# Patient Record
Sex: Male | Born: 1965
Health system: Southern US, Community
[De-identification: ages and names within clinical notes are randomized; demographics above are authoritative.]

## PROBLEM LIST (undated history)

## (undated) DIAGNOSIS — E785 Hyperlipidemia, unspecified: Secondary | ICD-10-CM

## (undated) DIAGNOSIS — I10 Essential (primary) hypertension: Secondary | ICD-10-CM

## (undated) HISTORY — DX: Hyperlipidemia, unspecified: E78.5

## (undated) HISTORY — DX: Essential (primary) hypertension: I10

---

## 1998-03-20 ENCOUNTER — Encounter: Admission: RE | Admit: 1998-03-20 | Discharge: 1998-06-18 | Payer: Self-pay

## 2011-01-03 ENCOUNTER — Encounter: Payer: 59 | Attending: Internal Medicine | Admitting: *Deleted

## 2011-01-04 ENCOUNTER — Encounter: Payer: Self-pay | Admitting: *Deleted

## 2011-01-04 NOTE — Patient Instructions (Addendum)
Patient will schedule Core Diabetes Courses II and III as soon as possible and follow up prn.

## 2011-01-04 NOTE — Progress Notes (Signed)
  Patient was seen on 01/03/11 for the first of a series of three diabetes self-management courses at the [facility]. The following learning objectives were met by the patient during this course:   Defines the role of glucose and insulin  Identifies type of diabetes and pathophysiology  Defines the diagnostic criteria for diabetes and prediabetes  States the risk factors for Type 2 Diabetes  States the symptoms of Type 2 Diabetes  Defines Type 2 Diabetes treatment goals  Defines Type 2 Diabetes treatment options  States the rationale for glucose monitoring  Identifies A1C, glucose targets, and testing times  Identifies proper sharps disposal  Defines the purpose of a diabetes food plan  Identifies carbohydrate food groups  Defines effects of carbohydrate foods on glucose levels  Identifies carbohydrate choices/grams/food labels  States benefits of physical activity and effect on glucose  Review of suggested activity guidelines  A1c level: 7.1% (12/14/10 - per Gilman Buttner, RN @ [facility])  Handouts given during class include:  Type 2 Diabetes: Basics Book  My Food Plan Book  Food and Activity Log  Mr[employer] Quick & Easy Diabetic Cooking cookbook  Patient has established the following initial goals:  Increase exercise.  Follow a diabetes meal plan.  Take medications appropriately.  Monitor blood glucose.  Keep doctor's appointments.  Lose weight.  Get medical tests/labs done.  Follow-Up Plan: Pt will contact [facility] with the dates he plans to attend Core Classes II and III.

## 2011-04-26 ENCOUNTER — Encounter: Payer: 59 | Attending: Internal Medicine | Admitting: Dietician

## 2011-04-27 NOTE — Progress Notes (Signed)
  Patient was seen on 04/26/2011 for the second of a series of three diabetes self-management courses at the [facility]. The following learning objectives were met by the patient during this course:   Explain basic nutrition maintenance and quality assurance  Describe causes, symptoms and treatment of hypoglycemia and hyperglycemia  Explain how to manage diabetes during illness  Describe the importance of good nutrition for health and healthy eating strategies  List strategies to follow meal plan when dining out  Describe the effects of alcohol on glucose and how to use it safely  Describe problem solving skills for day-to-day glucose challenges  Describe strategies to use when treatment plan needs to change  Identify important factors involved in successful weight loss  Describe ways to remain physically active  Describe the impact of regular activity on insulin resistance  Patient updated their initials goals from Core Class I to include: Continues to work on self-management goals for Core Class 3  Handouts given in class:  Refrigerator magnet for Sick Day Guidelines  [facility] Oral medication/insulin handout  Follow-Up Plan: Patient will attend the final class of the ADA Diabetes Self-Care Education.

## 2011-06-07 ENCOUNTER — Encounter: Payer: 59 | Attending: Internal Medicine

## 2011-06-07 DIAGNOSIS — Z713 Dietary counseling and surveillance: Secondary | ICD-10-CM | POA: Insufficient documentation

## 2011-06-07 DIAGNOSIS — E119 Type 2 diabetes mellitus without complications: Secondary | ICD-10-CM | POA: Insufficient documentation

## 2011-06-13 NOTE — Progress Notes (Signed)
  Patient was seen on 06/07/2011 for the third of a series of three diabetes self-management courses at the [facility]. The following learning objectives were met by the patient during this course:    Describe how diabetes changes over time   Identify diabetes complications and ways to prevent them   Describe strategies that can promote heart health including lowering blood pressure and cholesterol   Describe strategies to lower dietary fat and sodium in the diet   Identify physical activities that benefit cardiovascular health   Evaluate success in meeting personal goal   Describe the belief that they can live successfully with diabetes day to day   Establish 2-3 goals that they will plan to diligently work on until they return for the free 91-month follow-up visit  The following handouts were given in class:  3 Month Follow Up Visit handout  Goal setting handout  Class evaluation form  Your patient has established the following 3 month goals for diabetes self-care:  Be active 45 minutes once a day.  Take my diabetes medications as scheduled.  Test my blood glucose 2-3 times every day.  Follow-Up Plan: Patient will attend a 3 month follow-up visit for diabetes self-management education.

## 2011-09-13 ENCOUNTER — Ambulatory Visit: Payer: 59 | Admitting: Dietician

## 2012-02-24 ENCOUNTER — Ambulatory Visit
Admission: RE | Admit: 2012-02-24 | Discharge: 2012-02-24 | Disposition: A | Discharge status: Home / Self Care | Payer: No Typology Code available for payment source | Source: Ambulatory Visit | Attending: Infectious Diseases | Admitting: Infectious Diseases

## 2012-02-24 ENCOUNTER — Other Ambulatory Visit: Payer: Self-pay | Admitting: Infectious Diseases

## 2012-02-24 DIAGNOSIS — Z9289 Personal history of other medical treatment: Secondary | ICD-10-CM

## 2012-07-02 ENCOUNTER — Ambulatory Visit (INDEPENDENT_AMBULATORY_CARE_PROVIDER_SITE_OTHER): Payer: 59 | Admitting: Family Medicine

## 2012-07-02 VITALS — BP 136/80 | HR 83 | Ht 66.0 in | Wt 175.0 lb

## 2012-07-02 DIAGNOSIS — E119 Type 2 diabetes mellitus without complications: Secondary | ICD-10-CM

## 2012-07-02 DIAGNOSIS — E1169 Type 2 diabetes mellitus with other specified complication: Secondary | ICD-10-CM | POA: Insufficient documentation

## 2012-07-02 NOTE — Progress Notes (Signed)
Subjective:  Patient presents today for 3 month diabetes follow-up as part of the employer-sponsored Link to Wellness program. Current diabetes regimen includes Victoza, metformin, and Actos. Patient also continues on daily ARB and statin. Most recent MD follow-up was 2 month ago. Patient has a pending appt for October. No med changes or major health changes at this time.     Disease Assessments:  Diabetes:  Type of Diabetes: Type 2; Sees Diabetes provider 3 times per year; MD managing Diabetes Dr. Allyne Gee; checks blood glucose 3-4 times a day; checks feet daily; uses glucometer; takes medications as prescribed; hypoglycemia frequency rarely;   Other Diabetes History: Didn't bring meter in. Checks 3x/day. Ranges per pt report. Generally checks fasting in am, before or after meals, sometimes at bedtime. Fasting 104-110. After meals 130-155.  Reports feeling low at 80-85. Doesn't happen very often.    Social History:  Medication adherence adherent; Marital status: Married; Denies alcohol use; Denies caffeine use; Exercise adherence 5 days or more days a week; Diet adherence Greater than 75% of the time; ~225 minutes of exercise per week.  Occupation: Gaffer   Physical activity - exercise almost every day - push ups, treadmill, weight lifting, chest presses, sit ups, some running. Usually for 45 min to 1 hr.  Diet:  24 hour recall  Breakfast - oatmeal  Lunch - vegetables, fries, meat  Dinner - rice  No snacks. Drinking water mostly, sometimes diet Pepsi.    Family History:  There is a family history of Hypertension.     47 yo son DMI    Preventive Care:    Hemoglobin A1c: 8.0    Dilated Eye Exam: 04/17/2012  Foot Exam: 04/17/2012     Historical Lab Results:  A1c per patient report at last visit with Dr. Allyne Gee in April 2014.    Assessment/Plan: Patient is here to re-establish with the Link to [facility]. His A1c is currently above goal of <7%  despite being on Victoza, metformin, and Actos. He appears knowledgable about his disease. He has an excellent exercise regimen but thinks it needs to be extended and more on weekends. Usually, he is walking on the treadmill for his cardio. Discussed incorporating jogging intervals into the walking to amp up his work out a bit more rather than extending the time.   He seems to know what the better food choices are. He did not want to set diet goals at this time, so maybe follow up that discussion in the future.   Follow up with patient in 3 months..   Goals for Next Visit- 1. Start jogging in intervals with your walking to amp up your cardio work outs. 2. Continue watching your diet.  Follow up in 3 months.   Next appointment: September 12 at 11:00

## 2012-10-08 NOTE — Progress Notes (Signed)
Patient ID: Phillip Armstrong, male   DOB: 01-07-1966, 47 y.o.   MRN: [id number] ATTENDING PHYSICIAN NOTE: I have reviewed the chart and agree with the plan as detailed above. Denny Levy MD Pager [phone]

## 2012-10-19 ENCOUNTER — Ambulatory Visit (INDEPENDENT_AMBULATORY_CARE_PROVIDER_SITE_OTHER): Payer: 59 | Admitting: Family Medicine

## 2012-10-19 VITALS — Ht 66.0 in | Wt 179.8 lb

## 2012-10-19 DIAGNOSIS — E119 Type 2 diabetes mellitus without complications: Secondary | ICD-10-CM

## 2012-10-19 NOTE — Progress Notes (Signed)
Subjective:  Patient presents today for 3 month diabetes follow-up as part of the employer-sponsored Link to Wellness program. Current diabetes regimen includes Victoza 1.8 mg SQ once daily and pioglitazone/metformin 15/850 mg po twice daily. Patient also continues on daily ASA, ARB, and statin.  Patient reports that things have been good since his last appointment. He reports that he saw Dr. Allyne Gee last month. He states that they checked his A1C at that time but he was not given a result. He reports that things are going well with the Victoza; denies N/V with the medication.   No medication changes since his last appointment with me.     Disease Assessments:  Diabetes:  Type of Diabetes: Type 2; Sees Diabetes provider 3 times per year; MD managing Diabetes Dr. Allyne Gee; checks blood glucose 3-4 times a day; checks feet daily; uses glucometer; takes medications as prescribed; hypoglycemia frequency rarely;   Highest CBG 180; Lowest CBG 85; Other Diabetes History: Checking 2-3 times a day. He did not bring his meter with him today. The following numbers are patient reported.  Fasting- usually running, 105, 135, 108. The highest he has had was 170-180 and this was in the afternoon. He states that it is not high like that often.   He states that the lowest he has had was 85 and he doesn't go low like this very often.      Physical Activity-  He states that he is still exercising daily. He walks, runs, does push ups, sit ups. He states that he would like to increase this to do more. 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 He goes to an outside gym 1-2 times during the week. He runs for 30 minutes, 20 minutes push ups, 20 minutes weight lifting. Some basketball with his kids too.   Diet:  24 hour recall  Breakfast - Lipton tea (no added sugar), some milk, 3 slices of bread, 1 biscuit, 1 egg  Lunch- didn't eat. He states that sometimes when he gets busy he  doesn't eat.   Dinner- fish, baked potatoes, spinach  Evening- 1 banana (he felt low, checked his BG and he was 106).  He states that usually he eats a lot of salads, vegetables, fruits,   Lunch - vegetables, fries, meat  Dinner - rice  No snacks. Drinking water mostly, sometimes diet Pepsi.    Assessment/Plan: Patient is a 47 year old male with DM2. I have faxed to Dr. Allyne Gee office to get the most recent A1C, but I have not heard back from their office yet. Visit today was cut short because patient had to leave to get to work.   Patient seems to have improved physical activity and is exercising more frequently than before. I encouraged him to try and get at least 150 minutes each week. He is always doing some push ups each day, but isn't always getting cardiac activity in.   We spent a very limited amount of time discussing carbohydrate counting. Review at next visit.   Will wait for A1C results from Dr. Allyne Gee office. Follow up with patient in 3 months..    Goals for Next Visit-  1. Increase physical activity on the days that you don't go to the gym- play basketball with your kids, walk, yard work. Aim to do this 3-4 times a week.   Next appointment to see me is Monday January 5th at 8:15 AM.

## 2012-12-17 ENCOUNTER — Emergency Department (HOSPITAL_COMMUNITY)
Admission: EM | Admit: 2012-12-17 | Discharge: 2012-12-17 | Disposition: A | Discharge status: Home / Self Care | Payer: 59 | Attending: Emergency Medicine | Admitting: Emergency Medicine

## 2012-12-17 ENCOUNTER — Emergency Department (HOSPITAL_COMMUNITY): Payer: 59

## 2012-12-17 ENCOUNTER — Encounter (HOSPITAL_COMMUNITY): Payer: Self-pay | Admitting: Emergency Medicine

## 2012-12-17 DIAGNOSIS — I10 Essential (primary) hypertension: Secondary | ICD-10-CM | POA: Insufficient documentation

## 2012-12-17 DIAGNOSIS — Z7982 Long term (current) use of aspirin: Secondary | ICD-10-CM | POA: Insufficient documentation

## 2012-12-17 DIAGNOSIS — M542 Cervicalgia: Secondary | ICD-10-CM | POA: Insufficient documentation

## 2012-12-17 DIAGNOSIS — Y9389 Activity, other specified: Secondary | ICD-10-CM | POA: Insufficient documentation

## 2012-12-17 DIAGNOSIS — Z79899 Other long term (current) drug therapy: Secondary | ICD-10-CM | POA: Insufficient documentation

## 2012-12-17 DIAGNOSIS — Y92410 Unspecified street and highway as the place of occurrence of the external cause: Secondary | ICD-10-CM | POA: Insufficient documentation

## 2012-12-17 DIAGNOSIS — E119 Type 2 diabetes mellitus without complications: Secondary | ICD-10-CM | POA: Insufficient documentation

## 2012-12-17 DIAGNOSIS — E785 Hyperlipidemia, unspecified: Secondary | ICD-10-CM | POA: Insufficient documentation

## 2012-12-17 MED ORDER — HYDROCODONE-ACETAMINOPHEN 5-325 MG PO TABS: 1.0000 | ORAL_TABLET | Freq: Four times a day (QID) | ORAL | Status: DC | PRN

## 2012-12-17 MED ORDER — NAPROXEN 500 MG PO TABS: 500.0000 mg | ORAL_TABLET | Freq: Two times a day (BID) | ORAL | Status: DC

## 2012-12-17 MED ORDER — METHOCARBAMOL 500 MG PO TABS: 500.0000 mg | ORAL_TABLET | Freq: Two times a day (BID) | ORAL | Status: DC
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
## 2012-12-19 NOTE — ED Provider Notes (Signed)
Medical screening examination/treatment/procedure(s) were performed by non-physician practitioner and as supervising physician I was immediately available for consultation/collaboration.  EKG Interpretation   None         Candyce Churn, MD 12/19/12 1309

## 2013-01-21 ENCOUNTER — Ambulatory Visit (INDEPENDENT_AMBULATORY_CARE_PROVIDER_SITE_OTHER): Payer: Self-pay | Admitting: Family Medicine

## 2013-01-21 VITALS — Ht 66.0 in | Wt 176.4 lb

## 2013-01-21 DIAGNOSIS — E119 Type 2 diabetes mellitus without complications: Secondary | ICD-10-CM

## 2013-01-21 NOTE — Progress Notes (Signed)
Subjective:  Patient presents today for 3 month diabetes follow-up as part of the employer-sponsored Link to Wellness program. Current diabetes regimen includes Victoza 1.8mg  SQ once daily and Pioglitzaone/Metformin 15-850 mg twice daily. Patient also continues on daily ASA, ARB and statin.   Refilled prescriptions today.   Patient had an appointment with Dr. Baird Cancer at the end of December and A1C was 7.0%.     Disease Assessments:  Diabetes:  Type of Diabetes: Type 2; Sees Diabetes provider 3 times per year; MD managing Diabetes Dr. Baird Cancer; checks feet daily; uses glucometer; takes medications as prescribed; hypoglycemia frequency rarely; checks blood glucose 2 times a day; 7 day CBG average 131; 14 day CBG average 131; 30 day CBG average 129; Highest CBG 186; Lowest CBG 102; Other Diabetes History:  He states that he is checking blood sugar first thing in the morning before he has anything to eat and again at bedtime or before dinner. He estimates that he has low blood sugars on ocassion- less than once a week.   On average the majority of his blood sugars are running between 100-130. On the few occasions that his CBG is elevated patient is able to identify the cause for elevation (partying on New Years Eve).        Physical Activity-  He states that he has been using the treadmill more. He admits that his physical activity decreased in the month of December. He estimates that every day he does something, even if it is just push ups and running in place. He does some weight lifting at home. He has lost 3 pounds since his last appointment with me. The new employee gym on [address] opened this last week and he has been using that too.   Nutrition:  He estimates that his portion sizes have gone down and this is part of the reason that he has lost weight (about 3 pounds since his last appointment with me). He also states that he is drinking more water lately too.       01/21/2013 8:31  AM (EST) BMI 28.5; Height 5 ft 6 in; Weight 176.4 lbs    Testing:  Blood Sugar Tests:  Hemoglobin A1c: 7.0 via Dr. Lynder Parents office resulted on 01/08/2013        Assessment/Plan: Patient is a 48 year old male with DM2. Most recent A1C last month was 7.0% which is meeting goal of equal to or less than 7%. Patient has also lost 3 pounds since his last appointment with me. Patient states that overall he has increased physical activity (even though he admits last month he didn't do as well as the other months) and he has decreased portion sizes. I congratulated patient on his success so far and encouraged him to continue.   Patient was not aware of the new wellness website and benefits. He has missed the first payout but will still be eligible for the next payout in September. So far he will be able to claim all of his badges except the weight goal. Although he is fairly close to meeting the 27.5 BMI cutoff. He would only need to lose 6 additional pounds before September to meet that goal. I gave him the information on how to log on to the website and how to claim his badges.   Follow up with patient in 3 months..    Goals for Next Visit-  Great job on the weight loss!! You are heading in the right direction!   1.  Log on to the new wellness website- [email or web address] and take the wellness assessment.  2. Continue physical activity- aim for 4-5 days a week.   Next appointment to see me is Friday March 27th at 8:15 AM.

## 2013-04-15 ENCOUNTER — Ambulatory Visit (INDEPENDENT_AMBULATORY_CARE_PROVIDER_SITE_OTHER): Payer: Self-pay | Admitting: Family Medicine

## 2013-04-15 VITALS — BP 135/104 | HR 90 | Ht 66.0 in | Wt 175.6 lb

## 2013-04-15 DIAGNOSIS — E119 Type 2 diabetes mellitus without complications: Secondary | ICD-10-CM

## 2013-04-15 NOTE — Progress Notes (Signed)
Subjective:  Patient presents today for 3 month diabetes follow-up as part of the employer-sponsored Link to Wellness program. Current diabetes regimen includes Victoza 1.8 mg SQ once daily and Pioglitazone/Metformin 15/850 mg po BID. Patient also continues on daily ASA, ARB, and statin.   Patient reports no changes with medication or with his health. He has a pending appointment with Dr. Baird Cancer in 4-6 weeks.     Disease Assessments:  Diabetes:  Type of Diabetes: Type 2; Sees Diabetes provider 3 times per year; MD managing Diabetes Dr. Baird Cancer; checks feet daily; uses glucometer; takes medications as prescribed; hypoglycemia frequency rarely; checks blood glucose 2 times a day;   7 day CBG average 122; 14 day CBG average 121; 30 day CBG average 123; Highest CBG 260; Lowest CBG 61; Other Diabetes History: Checking twice daily fasting and then again before dinner. He reports he is having low blood sugars once or twice a week. It is dropping to 80-90. When it drops he is eating something.   CBG Averages are similar to what they were last time.    Physical Activity-  He is still a member of the [employer] and he reports that he hasn't been going as often. 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 He reports that he has increased walking. He is trying to get everything fixed before Easter.   Nutrition:  He states that he is still eating smaller portion sizes. His weight is stable since last appointment.     Preventive Care:    Hemoglobin A1c: 04/12/2013 via POCT 7.0    Dilated Eye Exam: 04/17/2012  Flu vaccine: 09/17/2012  Foot Exam: 04/17/2012        Vital Signs:  04/15/2013 2:38 PM (EST) Blood Pressure 135 / 104 mm/HgBMI 28.3; Height 5 ft 6 in; Pulse Rate 90 bpm; Weight 175.6 lbs    Testing:  Blood Sugar Tests:  Hemoglobin A1c: 7.0 via POCT resulted on 04/12/2013     Assessment/Plan: Patient is a 48 year old male with DM2. A1C today was 7.0% and is meeting goal of less than or equal to 7%. Patient reports that he hasn't been able to exercise as much lately, but he has been active with refurbishing/renovating his church. He reports that his eating habits are roughly the same as they were at his last appointment.   One of his goals at the last appointment was to log into the new [employer] and claim his rewards. We tried to log in today and patient was unable to remember his password. I showed him how to request his password to be reset and explained how to claim his rewards.   Patient has not been to see a dentist in several years. I showed him how to look up dentists that were covered under his dental insurance and encouraged him to make an appointment for a cleaning and exam.   Follow up with patient in 4 months when I return from maternity leave. I will also fax Dr. Baird Cancer a copy of his A1C.     Goals for Next Visit-  1. Find a dentist and make an appointment to see the dentist. You are paying for the checkup already in your insurance!!! Dr. Gennette Pac phone number is [phone], address [address] (bottom floor). 2. Get back into physical activity and start going to the [facility] again.  3. Log into the wellness  website- [email or web address] so that you can claim your wellness benefits. Call me if you need help logging in.   Next appointment to see me is Friday August 14th at 8:15 AM.

## 2013-08-21 ENCOUNTER — Encounter: Payer: Self-pay | Admitting: Family Medicine

## 2013-08-21 NOTE — Progress Notes (Signed)
Patient ID: Phillip Armstrong, male   DOB: [birth year], 48 y.o.   MRN: [id number] Reviewed: Agree with our pharmacologist's documentation and management.

## 2013-08-21 NOTE — Progress Notes (Signed)
Patient ID: Phillip Armstrong, male   DOB: [birth year], 48 y.o.   MRN: [id number] Reviewed: Agree with our pharmacologist's documentation and management.

## 2013-08-21 NOTE — Progress Notes (Signed)
Patient ID: Phillip Armstrong, male   DOB: April 15, 1965, 48 y.o.   MRN: [id number] Reviewed: Agree with our pharmacologist's documentation and management.

## 2013-08-30 ENCOUNTER — Ambulatory Visit (INDEPENDENT_AMBULATORY_CARE_PROVIDER_SITE_OTHER): Payer: Self-pay | Admitting: Family Medicine

## 2013-08-30 VITALS — BP 115/80 | Ht 66.0 in | Wt 180.0 lb

## 2013-08-30 DIAGNOSIS — E119 Type 2 diabetes mellitus without complications: Secondary | ICD-10-CM

## 2013-08-30 NOTE — Progress Notes (Signed)
Subjective:  Patient presents today for 3 month diabetes follow-up as part of the employer-sponsored Link to Wellness program. Current diabetes regimen includes Victoza 1.8 mg SQ once daily, Pioglitazone/Metformin 15/850 mg BID. Patient also continues on daily ASA, ARB and statin. No med changes or major health changes at this time. Last visit with Dr. Baird Cancer was in June. He said that he had an A1C checked, can't remember what it was. No medication changes at that visit. Refilled medications for patient. Some were late (2 months) on refilling. Patient states that he takes his medications and doesn't miss doses very often. He reports that he has been doing well since his last visit with me. He saw a dentist over the summer (he hadn't seen one in a few years) and he also had an eye exam over the summer. He reports that everything was fine with his teeth and his eyes.   Disease Assessments:  Diabetes: Type of Diabetes: Type 2; Sees Diabetes provider 3 times per year; MD managing Diabetes Dr. Baird Cancer; checks feet daily; uses glucometer; takes medications as prescribed; hypoglycemia frequency rarely;   checks blood glucose 2 times a day;   7 day CBG average 128; 14 day CBG average 131; 30 day CBG average 141; Highest CBG 186; Lowest CBG 100; Other Diabetes History:  He states that he is checking mostly twice a day. He is checking in the morning fasting and then again before or after dinner. Mostly before dinner. He states that he is not going low very often.  There are some higher readings in the last couple of weeks (high 100s). He is taking Victoza in the evening before dinner. Sometimes when his blood sugar is lower (around 100 before dinner) when he checks it before dinner he omits Victoza because he is worried about going low. He is still taking metformin/pioglitazone in the evening. He does not want to take Victoza in the morning because he states that he is worried that his blood sugar will go too  low during the day. However, when he doesn't take Victoza in the evening, fasting CBGs are in the 170-180s. We compromised and he agreed that when pre dinner CBG is low he will take Victoza 0.6 mg SQ instead of omitting it completely.      Tobacco Assessment: Smoking Status: Never smoker; Last Reviewed: 08/30/2013   Social History:  Marital status: Married; Denies alcohol use; Denies caffeine use; Diet adherence Greater than 75% of the time; Exercise adherence 3-4 days a week; 150 minutes of exercise per week; Medication adherence adherent.  Occupation: Neurosurgeon  Physical Activity- He states that he has been walking more at [employer]. He is also doing some weight lifting, treadmills, push ups, stair climber. He estimates that he goes three days a week for about 45-60 minutes.  Nutrition: Weight is up about 5 pounds since his last appointment. Patient states that there haven't been much changes with his eating habits since the last visit with me. He states he is eating more salads and fruits over the summer. He states sometimes he forgets to eat because he is so busy.           Preventive Care:      Dilated Eye Exam: 07/17/2013  Flu vaccine: 09/17/2012  Foot Exam: 04/17/2012  Other Preventive Care Notes:  Dental Visit-August 2015      Overall Health Assessments:  Vision:  Dilated Eye Exam: 07/17/2013   Vital Signs:  08/30/2013 8:22 AM (EST)Blood Pressure 115 /  80 mm/HgBMI 29.0; Height 5 ft 6 in; Weight 180 lbs       Assessment/Plan: Patient is a 48 year old male with DM2. Patient recently saw Dr. Baird Cancer and had his A1C taken. He did not remember what it was but said that he thought it was good. I will fax over to Dr. Baird Cancer office to get the most recent A1C and lipid panel. CBG averages are similar to last visit (~10 points higher, but still within range) so I expect that his A1C is likely still close to meeting goal of less than 7%. I encouraged patient to  continue physical activity and to increase as he is able. I also encouraged patient to log onto the new wellness website so that he can claim his rewards. Follow up with patient in 3 months.      Goals for Next Visit- 1. Continue exercising. Aim to go three days a week for 45 minutes. Even when your son's basketball season is over, make sure that you still get over to the [facility]. 2. When your blood sugar is low before dinner, instead of skipping Victoza completely, just take 0.6 mg. 3. Log onto the [email or web address] to be able to claim your wellness badges. Contact Terri Skains [phone]. The deadline is September 1. Next appointment to see me is Monday November 16th at 8 AM.

## 2013-11-14 ENCOUNTER — Encounter: Payer: Self-pay | Admitting: Family Medicine

## 2013-11-14 NOTE — Progress Notes (Signed)
Patient ID: Phillip Armstrong, male   DOB: 03-25-1965, 48 y.o.   MRN: [id number] Reviewed: Agree with the documentation and management of our [place].

## 2013-12-02 ENCOUNTER — Ambulatory Visit (INDEPENDENT_AMBULATORY_CARE_PROVIDER_SITE_OTHER): Payer: Self-pay | Admitting: Family Medicine

## 2013-12-02 VITALS — BP 126/72 | Wt 180.0 lb

## 2013-12-02 DIAGNOSIS — E119 Type 2 diabetes mellitus without complications: Secondary | ICD-10-CM

## 2013-12-02 NOTE — Assessment & Plan Note (Signed)
Subjective:  Patient presents today for 3 month diabetes follow-up as part of the employer-sponsored Link to Wellness program. Current diabetes regimen includes Victoza 1.8 mg SQ daily and Pioglitazone/Metformin 15/850 mg twice daily. Patient also continues on daily ASA, ARB, and statin.   Most recent MD follow-up was in September with Dr. Baird Cancer. Patient has a pending appt for February with her. No med changes or major health changes at this time.  Patient states that he has been doing well since his last visit with me. He states that in September his A1C was 8.0%. He states that his PCP made no changes to his medication after that result. He states that he has still been exercising and paying more attention to his portion sizes in an attempt to bring A1C back down.   Advanced Care Planning: Date Discussed 12/14/2010; Code Status FULL CODE  Disease Assessments:  Diabetes: Type of Diabetes: Type 2; Sees Diabetes provider 3 times per year; MD managing Diabetes Dr. Baird Cancer; checks feet daily; uses glucometer; takes medications as prescribed; hypoglycemia frequency rarely; checks blood glucose 2 times a day;   7 day CBG average 135; 14 day CBG average 132; 30 day CBG average 131; Highest CBG 157; Lowest CBG 86;   Other Diabetes History:  He states that he is checking twice daily. He states that sometimes he is going low into the 70s. This happens maybe once a week or less. To correct this he is eating a small snack. The majority of the readings are in the 120-130s. He is still taking Victoza at night. He is not interested in taking it in the morning, even though I explained it work more effectively when taken in the day time. At his last visit he was skipping doses of Victoza if he felt like his CBG was too low. He has stopped doing this and is not missing any doses of Victoza.   Past Medical/Surgical History:  Prior Surgery: None   Tobacco Assessment: Smoking Status: Never smoker; Last Reviewed:  12/02/2013   Social History:  Marital status: Married; Denies alcohol use; Denies caffeine use; Diet adherence Greater than 75% of the time; Medication adherence adherent; Exercise adherence 1-2 days a week; 50 minutes of exercise per week.  Occupation: Neurosurgeon  Physical Activity- Patient states that he is still going to [employer]. He states that his kids are now in an after school activity for [employer] and he hasn't had as much time to exercise as he is picking kids up and dropping enough. He states that he is going at least once a week to the [facility] for at least 45 minutes. He states that he has some equipment to use at home but finding the time is the biggest barrier for him right now.  Nutrition: He states that he thinks he is doing well. No changes since his last appointment with me. Weight is stable from last visit, but he is still about 5 pounds higher that his last visit.    Preventive Care:      Dilated Eye Exam: 07/17/2013  Flu vaccine: 10/17/2013  Foot Exam: 04/17/2012  Other Preventive Care Notes:  Dental Visit-August 2015      Vital Signs:  12/02/2013 4:02 PM (EST)Blood Pressure 126 / 72 mm/HgBMI 29.0; Height 5 ft 6 in; Weight 180 lbs   Testing:  Blood Sugar Tests: Hemoglobin A1c: 8.0 via dr. Baird Cancer office resulted on 09/17/2013   Care Planning:  Learning Preference Assessment:  Learner: Patient  Readiness to  Learn Barriers: None  Teaching Method: Demonstration, Explanation, Handout  Evaluation of Learning: Can function independently and verbalize knowledge  Readiness to Change:  How important is your health to you? 10  How confident are you in working to improve your health? 10  How ready are you to change to improve your health? 10  Total Score: 10  Care Plan:  12/02/2013 4:02 PM (EST) (1)  Problem: Physical Inactivity  Role: Clinical Pharmacist  Long Term Goal Schedule time to exercise during the week, even if you are busy. If you can't get to  the [facility] to exercise, do exercises at home.     Assessment/Plan:  Patient is a 48 year old male with DM2. Most recent A1C was 8.0% and is above goal of less than 7%. The jump in A1C coincides with a decrease in physical activity. Patient reports that eating habits are roughly the same. 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 I encouraged him to schedule time to exercise and to make it a part of his week. Previously he has done much better with physical activity. He states that he knows what he needs to do in order to achieve improved glycemic control, but just hasn't implementing anything.  Follow up with patient in 3 months. I explained to patient that at next visit if A1C was still greater than 8% he would need to see me back in 6 weeks instead of 3 months.  Goals for Next Visit- 1. Increase physical activity. Schedule time during the week to exercise even if you are busy. Aim to have some physical activity every day. 2. Think about what meter you would like to switch to. Email me or call me when you make a decision. The meters that will be free next year are [employer] Next meter and True [employer] meter. Next appointment to see me is Monday February 22nd at 8 AM.

## 2014-01-06 NOTE — Progress Notes (Signed)
Patient ID: Phillip Armstrong, male   DOB: [birth year], 48 y.o.   MRN: [id number] Reviewed: Agree with the documentation and management of our [place].

## 2014-04-18 ENCOUNTER — Ambulatory Visit: Payer: Self-pay | Admitting: Pharmacist

## 2014-04-21 ENCOUNTER — Ambulatory Visit (INDEPENDENT_AMBULATORY_CARE_PROVIDER_SITE_OTHER): Payer: Self-pay | Admitting: Family Medicine

## 2014-04-21 VITALS — BP 120/84 | Wt 176.6 lb

## 2014-04-21 DIAGNOSIS — E119 Type 2 diabetes mellitus without complications: Secondary | ICD-10-CM

## 2014-04-21 NOTE — Patient Instructions (Signed)
Goals for Next Visit:  1. Continue physical activity. Aim to exercise every single day, even if you are just walking. Increase your intensity of exercise on the day you are able to go to the gym.  2. Continue to decrease portion sizes.     Next appointment to see me is: Monday June 27th at 8 AM.

## 2014-04-21 NOTE — Progress Notes (Signed)
Subjective:  Patient presents today for 3 month diabetes follow-up as part of the employer-sponsored Link to Wellness program.  Current diabetes regimen includes Victoza 1.8 mg SQ daily and  ActoPlus Met 15-850 mg po BID. Patient also continues on daily ASA, Angiotensin Receptor Blocker and statin.   No med changes or major health changes at this time.    Patient reports no changes since last time. Last visit with Dr. Baird Cancer was 2 weeks ago and he reports that his A1C was 7.3%. He reports that no medication changes were made.   Assessment/Plan:  Patient is a 49  yo malewith DM 2. Most recent A1C was 7.3  % which is at slightly exceeding goal of less than 7%. Weight is decreased by 4 pounds from last visit with me.   Lifestyle improvements:  Physical Activity-  Patient has increased physical activity since last visit. He is walking daily and has started going back to the gym at least once a week. He is tracking his workouts in the employee wellness website [employer]. He has also claimed his badges for tobacco free, Link to Wellness, PCP visit.   Patient has not been able to claim healthy weight badge as his BMI is 28.5. He needs to get down to 170 pounds in order to have a BMI <27.5 and claim the badge.   Nutrition-  Patient reports no major changes. He reports that he has tried to cut back on portion sizes.   Andrew        Office Visit from 04/21/2014 in [place] Problem One  BMI>27.5   Care Plan for Problem One  Active   Interventions for Problem One Long Term Goal  Continue daily physical activity (walking). Get to the gym at least once a week.    [facility] Long Term Goal (31-90 days)  Patient would like to get down to 170 pounds (BMI~27.5). Patient will continue to decrease portion sizes and will increase intensity of physical activity   [facility]N Long Term Goal Start Date  04/21/14      Follow up with me in 3 months.   Goals for Next  Visit:  1. Continue physical activity. Aim to exercise every single day, even if you are just walking. Increase your intensity of exercise on the day you are able to go to the gym.  2. Continue to decrease portion sizes.     Next appointment to see me is: Monday June 27th at [place] D. Donneta Romberg, PharmD, BCPS, CDE Norm Parcel to [place] Coordinator [phone]

## 2014-04-22 NOTE — Progress Notes (Signed)
ATTENDING PHYSICIAN NOTE: I have reviewed the chart and agree with the plan as detailed above. Sara Neal MD Pager [phone]  

## 2014-06-13 ENCOUNTER — Encounter: Payer: Self-pay | Admitting: Pharmacist

## 2014-06-30 ENCOUNTER — Other Ambulatory Visit: Payer: Self-pay | Admitting: Nurse Practitioner

## 2014-06-30 ENCOUNTER — Ambulatory Visit
Admission: RE | Admit: 2014-06-30 | Discharge: 2014-06-30 | Disposition: A | Discharge status: Home / Self Care | Payer: 59 | Source: Ambulatory Visit | Attending: Internal Medicine | Admitting: Internal Medicine

## 2014-06-30 DIAGNOSIS — M25561 Pain in right knee: Secondary | ICD-10-CM

## 2014-07-14 ENCOUNTER — Ambulatory Visit: Payer: 59 | Admitting: Pharmacist

## 2014-07-14 ENCOUNTER — Ambulatory Visit (INDEPENDENT_AMBULATORY_CARE_PROVIDER_SITE_OTHER): Payer: Self-pay | Admitting: Family Medicine

## 2014-07-14 VITALS — BP 114/76 | Ht 66.0 in | Wt 178.4 lb

## 2014-07-14 DIAGNOSIS — E119 Type 2 diabetes mellitus without complications: Secondary | ICD-10-CM

## 2014-07-14 NOTE — Progress Notes (Signed)
Subjective:  Patient presents today for 3 month diabetes follow-up as part of the employer-sponsored Link to Wellness program.  Current diabetes regimen includes Victoza 1.8 mg SQ once daily, pioglitazone 15-850 mg BID. Patient also continues on daily ASA, ARB and statin.  Most recent MD follow-up was last week with Dr. Baird Cancer. He states that he had an A1C drawn but he does not know the result.   No med changes or major health changes at this time.    Assessment/Plan:  Patient is a 49 y.o. male with DM 2. I will fax Dr. Baird Cancer to find out what his A1C result is.  Weight is stable from last visit with me.   CBG Review: Patient is checking 1-2 times daily. 7,14,30 day averages are in the 130s. Denies hypoglycemia. Fasting CBG range is 100-150.   Lifestyle improvements:  Physical Activity-  He is completing daily physical activity. He is doing a combination of walking, pushups, sit ups and weight lifting.   Nutrition-  Patient reports no changes. He is drinking water and diet soda. He states that he has tried to cut back on portion sizes since his last visit with me.    Follow up with me in 3 months.    Goals for Next Visit:  1. Continue physical activity and aim for some kind of exercise every day (walking, push ups, sit ups, etc.).  2. When you are snacking choose a fruit or a vegetable.     Next appointment to see me is: Monday September 26th at 8 AM.    [place]ett [place]. Donneta Romberg, PharmD, BCPS, CDE Norm Parcel to [place] Coordinator [phone]

## 2014-07-22 NOTE — Progress Notes (Signed)
Patient ID: Phillip Armstrong, male   DOB: Apr 14, 1965, 49 y.o.   MRN: [id number] ATTENDING PHYSICIAN NOTE: I have reviewed the chart and agree with the plan as detailed above. Dorcas Mcmurray MD Pager [phone]

## 2014-10-13 ENCOUNTER — Ambulatory Visit: Payer: 59 | Admitting: Pharmacist

## 2014-10-13 ENCOUNTER — Encounter: Payer: Self-pay | Admitting: Pharmacist

## 2014-10-13 ENCOUNTER — Ambulatory Visit (INDEPENDENT_AMBULATORY_CARE_PROVIDER_SITE_OTHER): Payer: Self-pay | Admitting: Family Medicine

## 2014-10-13 VITALS — BP 138/96 | Ht 66.0 in | Wt 178.6 lb

## 2014-10-13 DIAGNOSIS — E119 Type 2 diabetes mellitus without complications: Secondary | ICD-10-CM

## 2014-10-13 NOTE — Progress Notes (Signed)
Subjective:  Patient presents today for 3 month diabetes follow-up as part of the employer-sponsored Link to Wellness program.  Current diabetes regimen includes Victoza 1.8 mg SQ once daily and Actoplus Met 15/850 mg BID.  Patient also continues on daily ASA, ARB and statin. No med changes or major health changes at this time.   No major changes since his last visit. Last visit with Dr. Baird Cancer was in June. A1C at that visit was 7.5%. He has an appointment pending to see her again next month in October for a diabetes follow up.    Assessment:  Diabetes: Most recent A1C was  7.5 % which is slightly exceeding goal of less than 7%. Weight is stable from last visit with me.   I explained to patient that an A1C of 7.5% was close to goal but still elevated above where we want him to be. I reminded him that fasting goal was around 100, and right now he about 20-30 points above that. I encouraged him to continue making healthy eating choices and to intensify physical activity to try and get A1C down to goal.    CBG Review: Checking 1-2 times daily. Usually fasting and then sometimes before dinner. Denies hypoglycemia.   Majority of the fasting readings are in the 120s-130s. High- 173 (patient states that was after eating a lot).   Lifestyle improvements:  Physical Activity-  Continues to exercise. He is walking daily for 35 minutes, doing push ups, crunches, hip adductions, core strengthening exercises. Also doing yard work at home and with his church.    Nutrition-  No major changes. He continues to avoid regular soda (except in times when he has a low blood sugar). He has cut back on portion sizes. He noticed that when he eats with his wife he will eat more. So he is not eating with her as much so that way he won't overeat. Previously they were sharing a plate and that was also causing him to overeat. Now he has his own plate.    Follow up with me in 3 months.    Plan/Goals for Next  Visit:  1. Continue to cut back on portion sizes. Use your own plate.  2. Increase the intensity of physical activity. Get to the gym at least once a week to exercise with the weight machines, treadmill, stair climber.     Next appointment to see me is: Friday January 13th at 8 AM.    [place]. Donneta Romberg, PharmD, BCPS, CDE Norm Parcel to [place] Coordinator [phone]

## 2014-11-03 NOTE — Progress Notes (Signed)
I reviewed this chart and agree with the pharmacist's recommendation.  

## 2015-01-26 MED FILL — PIOGLIT-METFORMIN 15-850: 15-850 | 90 days supply | Qty: 180 | Fill #3

## 2015-01-30 ENCOUNTER — Ambulatory Visit: Payer: 59 | Admitting: Pharmacist

## 2015-02-02 ENCOUNTER — Ambulatory Visit (INDEPENDENT_AMBULATORY_CARE_PROVIDER_SITE_OTHER): Payer: Self-pay | Admitting: Family Medicine

## 2015-02-02 ENCOUNTER — Encounter: Payer: Self-pay | Admitting: Pharmacist

## 2015-02-02 VITALS — BP 132/84 | Ht 66.0 in | Wt 175.4 lb

## 2015-02-02 DIAGNOSIS — E119 Type 2 diabetes mellitus without complications: Secondary | ICD-10-CM

## 2015-02-02 MED FILL — MELOXICAM 15 MG TABLET: 15 | 30 days supply | Qty: 30 | Fill #4

## 2015-02-02 NOTE — Progress Notes (Signed)
Subjective:  Patient presents today for 3 month diabetes follow-up as part of the employer-sponsored Link to Wellness program.  Current diabetes regimen includes ActoPlusMet 15/850 mg BID and Victoza 1.8mg  SQ daily. Patient also continues on daily ASA, ARB and statin.  Most recent MD follow-up was with Dr. Baird Cancer in November. Patient reports that A1C was 8%.  Patient has a pending appt for the end of the month with Dr. Baird Cancer. No med changes or major health changes at this time.   Assessment:  Diabetes: Most recent A1C was 8.0  % which is exceeding goal of less than 7%. Weight is decreased from last visit with me.   One of his goals at his last visit was to get down to 170 pounds so he could meet the BMI goal of 27.5 for the [employer] Well Program. He states that prior to travelling to [place] he did get down to 170 pounds, but has gained a few pounds since then.    CBG Review: He is checking 1-2 times daily. He states is checking fasting in the morning and then also checking before dinner. However on meter review, all the CBG checks in the last month and fasting readings.   High/Low- 181/89  Most of the fasting measurements are between 120-160.   Denies hypoglycemia.   Lifestyle improvements:  Physical Activity-  He states that since he has been back from [place] he hasn't been exercising as much. He states that before he left in December he was going to the gym twice a week- weightlifting and some cardio (treadmill); also doing push ups. Now he is just walking every day.   I explained to patient that with his elevated A1C he needed to focus on more consistent physical activity in order to improve glycemic control and to bring A1C back down to goal of less than 7%.    Nutrition-  He states he has been eating smaller portions lately. He states that overall he feels he is doing pretty good with eating lately.   Follow up with me in 3 months.    Plan/Goals for Next Visit:  1.  Start getting back to the gym to exercise twice a week. Aim for a variety of cardio activities and weight lifting.  2. Aim to walk daily for at least 30 minutes, more if you have the time.     Next appointment to see me is: Monday April 17th at 8 AM.    [place]. Donneta Romberg, PharmD, BCPS, CDE Norm Parcel to [place] Coordinator [phone]

## 2015-02-16 DIAGNOSIS — E1165 Type 2 diabetes mellitus with hyperglycemia: Secondary | ICD-10-CM | POA: Diagnosis not present

## 2015-02-16 DIAGNOSIS — I10 Essential (primary) hypertension: Secondary | ICD-10-CM | POA: Diagnosis not present

## 2015-02-16 DIAGNOSIS — E7800 Pure hypercholesterolemia, unspecified: Secondary | ICD-10-CM | POA: Diagnosis not present

## 2015-02-16 DIAGNOSIS — Z6829 Body mass index (BMI) 29.0-29.9, adult: Secondary | ICD-10-CM | POA: Diagnosis not present

## 2015-02-16 MED FILL — ROSUVASTATIN CALCIUM 20 MG: 20 | 90 days supply | Qty: 90 | Fill #0

## 2015-02-20 NOTE — Progress Notes (Signed)
I have reviewed this pharmacist's note and agree  

## 2015-02-24 MED FILL — VICTOZA 18 MG/3 ML INJECT P: 18 | 90 days supply | Qty: 27 | Fill #1

## 2015-03-16 MED FILL — MELOXICAM 15 MG TABLET: 15 | 30 days supply | Qty: 30 | Fill #5

## 2015-03-26 MED FILL — OLMESARTAN-HCTZ 40-25 MG TA: 40-25 | 90 days supply | Qty: 90 | Fill #2

## 2015-04-23 MED FILL — MELOXICAM 15 MG TABLET: 15 | 30 days supply | Qty: 30 | Fill #0

## 2015-04-23 MED FILL — PIOGLIT-METFORMIN 15-850: 15-850 | 90 days supply | Qty: 180 | Fill #0

## 2015-05-04 ENCOUNTER — Ambulatory Visit: Payer: Self-pay | Admitting: Pharmacist

## 2015-05-08 ENCOUNTER — Encounter: Payer: Self-pay | Admitting: Pharmacist

## 2015-05-08 ENCOUNTER — Ambulatory Visit: Payer: Self-pay | Admitting: Pharmacist

## 2015-05-08 VITALS — BP 132/90 | Ht 66.0 in | Wt 176.0 lb

## 2015-05-08 DIAGNOSIS — E119 Type 2 diabetes mellitus without complications: Secondary | ICD-10-CM

## 2015-05-08 MED FILL — ACCU-CHEK FASTCLIX LANCETS: 90 days supply | Qty: 204 | Fill #1

## 2015-05-08 MED FILL — ROSUVASTATIN CALCIUM 20 MG: 20 | 90 days supply | Qty: 90 | Fill #1

## 2015-05-08 MED FILL — ACCU-CHEK SMARTVIEW STRIP: 90 days supply | Qty: 200 | Fill #1

## 2015-05-08 MED FILL — BD PEN NDL NANO 32GX5/32: 32G X 4 MM | 90 days supply | Qty: 100 | Fill #1

## 2015-05-08 NOTE — Progress Notes (Signed)
Subjective:  Patient presents today for 3 month diabetes follow-up as part of the employer-sponsored [employer] to Wellness program.  Current diabetes regimen includes pioglitazone/metformin 15/850 mg BID & Victoza 1.8 mg SQ daily. Patient also continues on daily ASA, ARB and statin.  Most recent MD follow-up was with Dr. Baird Cancer in February of this year. A1C at that visit was 6.0%. Patient has a pending appt for July 2017. No med changes or major health changes at this time.     Assessment:  Diabetes: Most recent A1C was 6.0 % which is at goaL of less than 7%. Weight is decreased from last visit with me.   A1C decreased from 8.0% at his last visit with me to 6.0% with Dr. Baird Cancer in February. Patient states that he has been increasing physical activity and cutting back on portion sizes. He thinks this is the reason his A1C is down.   CBG Review: He states he is checking every morning and then sometimes again in the afternoon or evening.   CBG averages are very similar to last visit. Denies hypoglycemia.   High/Low- 193 (this was soon after eating 3 graham crackers) /102 Fasting CBGs are averaging around 130.   Lifestyle improvements:  Physical Activity-  He states that this has increased since his last visit with me. During the week he is working out at [employer] or [employer]. He is exercising at the gym three days a week for 60-90 minutes. He is doing a combination of weight lifting, stationary bike, treadmill, hill climber, push ups, sit ups. In addition to this he is walking 30 minutes daily.    Nutrition-  He states that he has cut back on portion sizes. He has increased vegetable intake and has cut back on carbohydrate intake overall. Mostly he is getting 2-3 servings of carbohydrates with meals.   B- wheat bread, tea, sometimes an egg L- baked chicken wings, zuchhinni, corn; eating salad; sometimes eating leftovers D- Rice, salad. He states he is getting a variety of foods.     Follow up with me in 3 months.    Plan/Goals for Next Visit:  1. Continue physical activity- continue exercising at the gym three times a week for 60 minutes. Also continue walking.  2. Continue making healthy eating choices.     Next appointment to see me is: Friday July 21st at 8 AM.    Truett Mainland. Donneta Romberg, PharmD, BCPS, CDE Norm [facility] to [place] Coordinator [phone]

## 2015-05-22 MED FILL — VICTOZA 18 MG/3 ML INJECT P: 18 | 90 days supply | Qty: 27 | Fill #2

## 2015-05-27 MED FILL — MELOXICAM 15 MG TABLET: 15 | 30 days supply | Qty: 30 | Fill #1

## 2015-06-26 MED FILL — MELOXICAM 15 MG TABLET: 15 | 30 days supply | Qty: 30 | Fill #2

## 2015-06-29 MED FILL — OLMESARTAN-HCTZ 40-25 MG TA: 40-25 | 90 days supply | Qty: 90 | Fill #0

## 2015-06-30 DIAGNOSIS — E119 Type 2 diabetes mellitus without complications: Secondary | ICD-10-CM | POA: Diagnosis not present

## 2015-07-23 MED FILL — MELOXICAM 15 MG TABLET: 15 | 30 days supply | Qty: 30 | Fill #3

## 2015-07-23 MED FILL — PIOGLIT-METFORMIN 15-850: 15-850 | 90 days supply | Qty: 180 | Fill #1

## 2015-08-04 DIAGNOSIS — M722 Plantar fascial fibromatosis: Secondary | ICD-10-CM | POA: Diagnosis not present

## 2015-08-04 DIAGNOSIS — I10 Essential (primary) hypertension: Secondary | ICD-10-CM | POA: Diagnosis not present

## 2015-08-04 DIAGNOSIS — E1165 Type 2 diabetes mellitus with hyperglycemia: Secondary | ICD-10-CM | POA: Diagnosis not present

## 2015-08-04 DIAGNOSIS — Z0000 Encounter for general adult medical examination without abnormal findings: Secondary | ICD-10-CM | POA: Diagnosis not present

## 2015-08-07 ENCOUNTER — Ambulatory Visit: Payer: Self-pay | Admitting: Pharmacist

## 2015-08-10 ENCOUNTER — Encounter: Payer: Self-pay | Admitting: Pharmacist

## 2015-08-10 ENCOUNTER — Other Ambulatory Visit: Payer: Self-pay | Admitting: Pharmacist

## 2015-08-10 VITALS — BP 142/80 | Wt 178.6 lb

## 2015-08-10 DIAGNOSIS — E119 Type 2 diabetes mellitus without complications: Secondary | ICD-10-CM

## 2015-08-10 MED FILL — ROSUVASTATIN CALCIUM 20 MG: 20 | 90 days supply | Qty: 90 | Fill #2

## 2015-08-10 MED FILL — BD PEN NDL NANO 32GX5/32: 32G X 4 MM | 90 days supply | Qty: 100 | Fill #2

## 2015-08-10 MED FILL — ACCU-CHEK SMARTVIEW STRIP: 90 days supply | Qty: 200 | Fill #2

## 2015-08-10 MED FILL — ACCU-CHEK FASTCLIX LANCETS: 90 days supply | Qty: 204 | Fill #2

## 2015-08-10 MED FILL — VICTOZA 18 MG/3 ML INJECT P: 18 | 90 days supply | Qty: 27 | Fill #3

## 2015-08-10 NOTE — Patient Outreach (Signed)
Subjective:  Patient presents today for 3 month diabetes follow-up as part of the employer-sponsored [employer] to Wellness program.  Current diabetes regimen includes Victoza 1.8 mg SQ once daily and ActoPlus met 15/850 mg BID.   Patient also continues on daily ASA,ARB and statin.  Most recent MD follow-up was last week with Dr. Baird Cancer. He had bloodwork drawn but he has not heard the results yet.  Patient has a pending appt for September with Dr. Baird Cancer.  No med changes or major health changes at this time.     Assessment:  Diabetes: Most recent A1C was  6.0 % which is at goal of less than 7%. Weight is stable from last visit with me.    CBG Review: Patient is checking CBG 1-2 times daily, mostly just once fasting. Fasting is ranging in the 130-150 range. CBG averages are about 10 points higher than he was at last visit.   Denies hypoglycemia.   High/Low- 261/85  Lifestyle improvements:  Physical Activity-  He is still weight lifting, walking, push ups, squats, chest press. He is walking daily 55 minutes usually. Sometimes he is walking up to 60 minutes. He is weight lifting almost every day (he has a set of weights at home that he uses).   Nutrition-  He admits that sometimes he is waiting too long to eat meals (usually dinner, sometimes lunch time) and then is overeating and portion sizes are bigger. We discussed that this may be the reason that blood sugars have been slightly higher the next morning for fasting.    Follow up with me in 5 months when I return from maternity leave. Fax to Dr. Lynder Parents office to get A1C results.    Plan/Goals for Next Visit:  1. Don't wait too long to eat meals. If you aren't going to be able to eat a meal at your normal time, it's OK to have a small snack so that you don't overeat when it's time for you to eat your meal.  2. Continue physical activity- walking daily and also lifting weights and doing strength training.    Next appointment to see me  is: Monday December 18th at 8 AM.    Truett Mainland. Donneta Romberg, PharmD, BCPS, CDE [facility] to [place] Coordinator [phone]

## 2015-08-27 MED FILL — NYSTOP 100,000 UNITS/GM PWD: 100000 | 30 days supply | Qty: 60 | Fill #0

## 2015-08-27 MED FILL — MELOXICAM 15 MG TABLET: 15 | 30 days supply | Qty: 30 | Fill #4

## 2015-09-02 DIAGNOSIS — Z1211 Encounter for screening for malignant neoplasm of colon: Secondary | ICD-10-CM | POA: Diagnosis not present

## 2015-09-25 MED FILL — OLMESARTAN-HCTZ 40-25 MG TA: 40-25 | 90 days supply | Qty: 90 | Fill #1

## 2015-09-25 MED FILL — MELOXICAM 15 MG TABLET: 15 | 30 days supply | Qty: 30 | Fill #5

## 2015-10-12 DIAGNOSIS — R2232 Localized swelling, mass and lump, left upper limb: Secondary | ICD-10-CM | POA: Diagnosis not present

## 2015-10-12 DIAGNOSIS — L0390 Cellulitis, unspecified: Secondary | ICD-10-CM | POA: Diagnosis not present

## 2015-10-12 DIAGNOSIS — E1165 Type 2 diabetes mellitus with hyperglycemia: Secondary | ICD-10-CM | POA: Diagnosis not present

## 2015-10-12 MED FILL — CEPHALEXIN 500 MG CAPSULE: 500 | 5 days supply | Qty: 20 | Fill #0

## 2015-10-22 MED FILL — PIOGLIT-METFORMIN 15-850: 15-850 | 90 days supply | Qty: 180 | Fill #0

## 2015-10-30 MED FILL — MELOXICAM 15 MG TABLET: 15 | 30 days supply | Qty: 30 | Fill #0

## 2015-11-11 MED FILL — ROSUVASTATIN CALCIUM 20 MG: 20 | 90 days supply | Qty: 90 | Fill #0

## 2015-11-19 MED FILL — VICTOZA 18 MG/3 ML INJECT P: 18 | 90 days supply | Qty: 27 | Fill #4

## 2015-12-01 MED FILL — MELOXICAM 15 MG TABLET: 15 | 30 days supply | Qty: 30 | Fill #1

## 2015-12-04 ENCOUNTER — Encounter (HOSPITAL_COMMUNITY): Payer: Self-pay | Admitting: *Deleted

## 2015-12-04 ENCOUNTER — Ambulatory Visit (HOSPITAL_COMMUNITY)
Admission: EM | Admit: 2015-12-04 | Discharge: 2015-12-04 | Disposition: A | Discharge status: Home / Self Care | Payer: 59 | Attending: Family Medicine | Admitting: Family Medicine

## 2015-12-04 DIAGNOSIS — I10 Essential (primary) hypertension: Secondary | ICD-10-CM

## 2015-12-04 DIAGNOSIS — E1165 Type 2 diabetes mellitus with hyperglycemia: Secondary | ICD-10-CM | POA: Diagnosis not present

## 2015-12-04 LAB — POCT I-STAT, CHEM 8
BUN: 17 mg/dL (ref 6–20)
Calcium, Ion: 1.26 mmol/L (ref 1.15–1.40)
Chloride: 93 mmol/L — ABNORMAL LOW (ref 101–111)
Creatinine, Ser: 0.9 mg/dL (ref 0.61–1.24)
Glucose, Bld: 236 mg/dL — ABNORMAL HIGH (ref 65–99)
HCT: 47 % (ref 39.0–52.0)
Hemoglobin: 16 g/dL (ref 13.0–17.0)
Potassium: 3.5 mmol/L (ref 3.5–5.1)
Sodium: 140 mmol/L (ref 135–145)
TCO2: 32 mmol/L (ref 0–100)

## 2015-12-04 MED ORDER — AMLODIPINE BESYLATE 5 MG PO TABS: 5.0000 mg | ORAL_TABLET | Freq: Every day | ORAL | 1 refills | Status: DC

## 2015-12-04 MED FILL — AMLODIPINE BESYLATE 5 MG TA: 5 | 30 days supply | Qty: 30 | Fill #0

## 2015-12-04 NOTE — ED Triage Notes (Signed)
Pt  Reports     He  Feels like  His bp  Is  High     He  Reports  Slight  Headache   And   Does  Not  Feel   Right        He  Takes      meds  Are  Better

## 2015-12-04 NOTE — ED Provider Notes (Signed)
[place]    CSN: [id number] Arrival date & time: 12/04/15  1002     History   Chief Complaint Chief Complaint  Patient presents with  . Hypertension    HPI Phillip Armstrong is a 50 y.o. male.   The history is provided by the patient.  Hypertension  This is a chronic problem. The current episode started more than 2 days ago (has had hbp since 2004, recently has found bp elevated and wants rx in spite of current rx and seeing lmd next week.). The problem has not changed since onset.Pertinent negatives include no chest pain, no abdominal pain, no headaches and no shortness of breath.    Past Medical History:  Diagnosis Date  . Diabetes mellitus 2000   T2DM  . Hyperlipidemia   . Hypertension     Patient Active Problem List   Diagnosis Date Noted  . Diabetes ([place]) 07/02/2012    History reviewed. No pertinent surgical history.     Home Medications    Prior to Admission medications   Medication Sig Start Date End Date Taking? Authorizing Provider  amLODipine (NORVASC) 5 MG tablet Take 1 tablet (5 mg total) by mouth daily. 12/04/15   Billy Fischer, MD  aspirin 81 MG tablet Take 81 mg by mouth daily.     Historical Provider, MD  Liraglutide (VICTOZA) 18 MG/3ML SOLN Inject 1.8 mg into the skin daily.      Historical Provider, MD  meloxicam (MOBIC) 15 MG tablet Take 15 mg by mouth daily as needed for pain.    Historical Provider, MD  Multiple Vitamins-Minerals (MULTIVITAMIN WITH MINERALS) tablet Take 1 tablet by mouth daily.    Historical Provider, MD  nystatin (MYCOSTATIN) powder Apply 1 g topically 2 (two) times daily as needed. Reported on 02/02/2015    Historical Provider, MD  olmesartan-hydrochlorothiazide (BENICAR HCT) 40-25 MG per tablet Take 1 tablet by mouth daily.      Historical Provider, MD  pioglitazone-metformin (ACTOPLUS MET) 15-850 MG per tablet Take 1 tablet by mouth 2 (two) times daily with a meal.      Historical Provider, MD  rosuvastatin  (CRESTOR) 20 MG tablet Take 20 mg by mouth daily.      Historical Provider, MD    Family History Family History  Problem Relation Age of Onset  . Diabetes Son     T1DM  . Hypertension Other     Social History Social History  Substance Use Topics  . Smoking status: Never Smoker  . Smokeless tobacco: Never Used  . Alcohol use No     Allergies   Patient has no known allergies.   Review of Systems Review of Systems  Constitutional: Negative.  Negative for fever.  Respiratory: Negative.  Negative for shortness of breath.   Cardiovascular: Negative.  Negative for chest pain, palpitations and leg swelling.  Gastrointestinal: Negative.  Negative for abdominal pain.  Neurological: Negative for headaches.     Physical Exam Triage Vital Signs ED Triage Vitals [12/04/15 1015]  Enc Vitals Group     BP 161/96     Pulse Rate 99     Resp 16     Temp 98.2 F (36.8 C)     Temp Source Oral     SpO2 100 %     Weight      Height      Head Circumference      Peak Flow      Pain Score  Pain Loc      Pain Edu?      Excl. in [place]?    No data found.   Updated Vital Signs BP 161/96 (BP Location: Left Arm)   Pulse 99   Temp 98.2 F (36.8 C) (Oral)   Resp 16   SpO2 100%   Visual Acuity Right Eye Distance:   Left Eye Distance:   Bilateral Distance:    Right Eye Near:   Left Eye Near:    Bilateral Near:     Physical Exam  Constitutional: He is oriented to person, place, and time. He appears well-developed and well-nourished.  Eyes: EOM are normal. Pupils are equal, round, and reactive to light.  Neck: Normal range of motion. Neck supple.  Cardiovascular: Normal rate, regular rhythm, normal heart sounds and intact distal pulses.   Pulmonary/Chest: Effort normal and breath sounds normal.  Neurological: He is alert and oriented to person, place, and time.  Skin: Skin is warm and dry.  Nursing note and vitals reviewed.    UC Treatments / Results  Labs (all labs  ordered are listed, but only abnormal results are displayed) Labs Reviewed  POCT I-STAT, CHEM 8 - Abnormal; Notable for the following:       Result Value   Chloride 93 (*)    Glucose, Bld 236 (*)    All other components within normal limits   I-stat bs 236   EKG  EKG Interpretation None       Radiology No results found.  Procedures Procedures (including critical care time)  Medications Ordered in UC Medications - No data to display   Initial Impression / Assessment and Plan / UC Course  I have reviewed the triage vital signs and the nursing notes.  Pertinent labs & imaging results that were available during my care of the patient were reviewed by me and considered in my medical decision making (see chart for details).  Clinical Course      Final Clinical Impressions(s) / UC Diagnoses   Final diagnoses:  Essential hypertension  Type 2 diabetes mellitus with hyperglycemia, without long-term current use of insulin ([place])    New Prescriptions Discharge Medication List as of 12/04/2015 11:25 AM    START taking these medications   Details  amLODipine (NORVASC) 5 MG tablet Take 1 tablet (5 mg total) by mouth daily., Starting Fri 12/04/2015, Print         Billy Fischer, MD 12/04/15 2021

## 2015-12-04 NOTE — Discharge Instructions (Signed)
Take medicine as prescribed, see your doctor as planned

## 2015-12-08 DIAGNOSIS — I10 Essential (primary) hypertension: Secondary | ICD-10-CM | POA: Diagnosis not present

## 2015-12-08 DIAGNOSIS — E1165 Type 2 diabetes mellitus with hyperglycemia: Secondary | ICD-10-CM | POA: Diagnosis not present

## 2015-12-08 DIAGNOSIS — Z79899 Other long term (current) drug therapy: Secondary | ICD-10-CM | POA: Diagnosis not present

## 2015-12-08 DIAGNOSIS — Z23 Encounter for immunization: Secondary | ICD-10-CM | POA: Diagnosis not present

## 2015-12-08 DIAGNOSIS — Z6829 Body mass index (BMI) 29.0-29.9, adult: Secondary | ICD-10-CM | POA: Diagnosis not present

## 2015-12-18 ENCOUNTER — Ambulatory Visit (HOSPITAL_COMMUNITY)
Admission: EM | Admit: 2015-12-18 | Discharge: 2015-12-18 | Disposition: A | Discharge status: Home / Self Care | Payer: 59 | Attending: Family Medicine | Admitting: Family Medicine

## 2015-12-18 ENCOUNTER — Ambulatory Visit (HOSPITAL_COMMUNITY): Payer: 59

## 2015-12-18 ENCOUNTER — Encounter (HOSPITAL_COMMUNITY): Payer: Self-pay | Admitting: Emergency Medicine

## 2015-12-18 ENCOUNTER — Ambulatory Visit (INDEPENDENT_AMBULATORY_CARE_PROVIDER_SITE_OTHER): Payer: 59

## 2015-12-18 DIAGNOSIS — L0291 Cutaneous abscess, unspecified: Secondary | ICD-10-CM

## 2015-12-18 DIAGNOSIS — L03011 Cellulitis of right finger: Secondary | ICD-10-CM

## 2015-12-18 DIAGNOSIS — M79645 Pain in left finger(s): Secondary | ICD-10-CM | POA: Diagnosis not present

## 2015-12-18 DIAGNOSIS — M7989 Other specified soft tissue disorders: Secondary | ICD-10-CM | POA: Diagnosis not present

## 2015-12-18 MED ORDER — KETOROLAC TROMETHAMINE 30 MG/ML IJ SOLN
INTRAMUSCULAR | Status: AC
  Filled 2015-12-18: qty 1

## 2015-12-18 MED ORDER — KETOROLAC TROMETHAMINE 60 MG/2ML IM SOLN
60.0000 mg | Freq: Once | INTRAMUSCULAR | Status: AC
  Administered 2015-12-18: 60 mg via INTRAMUSCULAR

## 2015-12-18 MED ORDER — CLINDAMYCIN HCL 300 MG PO CAPS: 300.0000 mg | ORAL_CAPSULE | Freq: Three times a day (TID) | ORAL | 0 refills | Status: DC

## 2015-12-18 MED FILL — CLINDAMYCIN HCL 300 MG CAPS: 300 | 7 days supply | Qty: 21 | Fill #0

## 2015-12-18 NOTE — ED Provider Notes (Signed)
Phillip Armstrong    CSN: [id number] Arrival date & time: 12/18/15  1207     History   Chief Complaint Chief Complaint  Patient presents with  . Abscess    HPI Phillip Armstrong is a 50 y.o. male.   HPI  Left pinky finger lesion. Started 4 days ago. Started as a boil. Intermittently drains. Purulent. Surrounding redness and tenderness has been getting larger. No involving the entire hand to the level of the wrist. Pain is constant. Over-the-counter anabolic ointment and soap and water washing without benefit. Denies any chest pain, shortness breath, palpitations, nausea, vomiting, vomiting, fevers.  Past Medical History:  Diagnosis Date  . Diabetes mellitus 2000   T2DM  . Hyperlipidemia   . Hypertension     Patient Active Problem List   Diagnosis Date Noted  . Diabetes ([place]) 07/02/2012    History reviewed. No pertinent surgical history.      Home Medications    Prior to Admission medications   Medication Sig Start Date End Date Taking? Authorizing Provider  amLODipine (NORVASC) 5 MG tablet Take 1 tablet (5 mg total) by mouth daily. 12/04/15  Yes Billy Fischer, MD  aspirin 81 MG tablet Take 81 mg by mouth daily.    Yes Historical Provider, MD  Liraglutide (VICTOZA) 18 MG/3ML SOLN Inject 1.8 mg into the skin daily.     Yes Historical Provider, MD  meloxicam (MOBIC) 15 MG tablet Take 15 mg by mouth daily as needed for pain.   Yes Historical Provider, MD  Multiple Vitamins-Minerals (MULTIVITAMIN WITH MINERALS) tablet Take 1 tablet by mouth daily.   Yes Historical Provider, MD  olmesartan-hydrochlorothiazide (BENICAR HCT) 40-25 MG per tablet Take 1 tablet by mouth daily.     Yes Historical Provider, MD  pioglitazone-metformin (ACTOPLUS MET) 15-850 MG per tablet Take 1 tablet by mouth 2 (two) times daily with a meal.     Yes Historical Provider, MD  rosuvastatin (CRESTOR) 20 MG tablet Take 20 mg by mouth daily.     Yes Historical Provider, MD  clindamycin (CLEOCIN)  300 MG capsule Take 1 capsule (300 mg total) by mouth 3 (three) times daily. 12/18/15   Waldemar Dickens, MD  nystatin (MYCOSTATIN) powder Apply 1 g topically 2 (two) times daily as needed. Reported on 02/02/2015    Historical Provider, MD    Family History Family History  Problem Relation Age of Onset  . Diabetes Son     T1DM  . Hypertension Other     Social History Social History  Substance Use Topics  . Smoking status: Never Smoker  . Smokeless tobacco: Never Used  . Alcohol use No     Allergies   Patient has no known allergies.   Review of Systems Review of Systems Per HPI with all other pertinent systems negative.    Physical Exam Triage Vital Signs ED Triage Vitals  Enc Vitals Group     BP 12/18/15 1237 141/83     Pulse Rate 12/18/15 1237 98     Resp 12/18/15 1237 18     Temp 12/18/15 1237 98.9 F (37.2 C)     Temp Source 12/18/15 1237 Oral     SpO2 12/18/15 1237 100 %     Weight --      Height --      Head Circumference --      Peak Flow --      Pain Score 12/18/15 1240 8     Pain Loc --  Pain Edu? --      Excl. in [place]? --    No data found.   Updated Vital Signs BP 141/83 (BP Location: Right Arm)   Pulse 98   Temp 98.9 F (37.2 C) (Oral)   Resp 18   SpO2 100%   Visual Acuity Right Eye Distance:   Left Eye Distance:   Bilateral Distance:    Right Eye Near:   Left Eye Near:    Bilateral Near:     Physical Exam Physical Exam  Constitutional: oriented to person, place, and time. appears well-developed and well-nourished. No distress.  HENT:  Head: Normocephalic and atraumatic.  Eyes: EOMI. PERRL.  Neck: Normal range of motion.  Cardiovascular: RRR,  2+ distal pulses,  Pulmonary/Chest: Effort normal. No respiratory distress.  Abdominal: Soft. Bowel sounds are normal. NonTTP, no distension.  Musculoskeletal: Normal range of motion. Non ttp, no effusion.  Neurological: alert and oriented to person, place, and time.  Skin: 2.53 cm area  of macerated skin with purulent discharge. Left pinky finger and dorsum of the hand red and swollen. Less than 2 second capillary refill of the of the finger.  Psychiatric: normal mood and affect. behavior is normal. Judgment and thought content normal.    UC Treatments / Results  Labs (all labs ordered are listed, but only abnormal results are displayed) Labs Reviewed - No data to display  EKG  EKG Interpretation None       Radiology No results found.  Procedures .Marland KitchenIncision and Drainage Date/Time: 12/18/2015 1:53 PM Performed by: Marily Memos, Samanatha Brammer J Authorized by: Marily Memos, Chanta Bauers J   Consent:    Consent obtained:  Verbal   Consent given by:  Patient   Risks discussed:  Bleeding, incomplete drainage, pain and damage to other organs   Alternatives discussed:  No treatment Location:    Type:  Abscess   Location:  Upper extremity   Upper extremity location:  Finger   Finger location:  L small finger Anesthesia (see MAR for exact dosages):    Anesthesia method:  None Procedure type:    Complexity:  Simple Procedure details:    Needle aspiration: no     Incision types:  Single straight   Scalpel blade:  11   Drainage amount:  Moderate   Wound treatment:  Wound left open   Packing materials:  None Post-procedure details:    Patient tolerance of procedure:  Tolerated well, no immediate complications   (including critical care time)  Medications Ordered in UC Medications  ketorolac (TORADOL) injection 60 mg (not administered)     Initial Impression / Assessment and Plan / UC Course  I have reviewed the triage vital signs and the nursing notes.  Pertinent labs & imaging results that were available during my care of the patient were reviewed by me and considered in my medical decision making (see chart for details).  Clinical Course     Cellulitis and abscess. Drain as above. Start clindamycin. X-ray without evidence of osteomyelitis. Concern for MRSA. Patient with  clear understanding that if symptoms do not improve within 2 days heat to the emergency room for IV antibiotics.  Final Clinical Impressions(s) / UC Diagnoses   Final diagnoses:  Abscess  Cellulitis of finger of right hand    New Prescriptions New Prescriptions   CLINDAMYCIN (CLEOCIN) 300 MG CAPSULE    Take 1 capsule (300 mg total) by mouth 3 (three) times daily.     Waldemar Dickens, MD 12/18/15 [phone]

## 2015-12-18 NOTE — Discharge Instructions (Signed)
You have a significant infection of your left hand. Fortunately there is no evidence of osteomyelitis, or bone infection. Please take the clindamycin as prescribed. Please go to the emergency room if you do not improve within the next 2 days for likely admission to the hospital and IV antibiotics.

## 2015-12-18 NOTE — ED Triage Notes (Signed)
The patient presented to the [facility] with a complaint of a possible abscess on the pinky finger of his left hand that has been there 3 days.

## 2015-12-23 MED FILL — OLMESARTAN-HCTZ 40-25 MG TA: 40-25 | 90 days supply | Qty: 90 | Fill #2

## 2015-12-28 MED FILL — MELOXICAM 15 MG TABLET: 15 | 30 days supply | Qty: 30 | Fill #2

## 2015-12-28 MED FILL — AMLODIPINE BESYLATE 5 MG TA: 5 | 90 days supply | Qty: 90 | Fill #0

## 2016-01-04 ENCOUNTER — Ambulatory Visit: Payer: Self-pay | Admitting: Pharmacist

## 2016-01-20 MED FILL — PIOGLIT-METFORMIN 15-850: 15-850 | 90 days supply | Qty: 180 | Fill #1

## 2016-01-21 DIAGNOSIS — R197 Diarrhea, unspecified: Secondary | ICD-10-CM | POA: Diagnosis not present

## 2016-01-21 DIAGNOSIS — R000 Tachycardia, unspecified: Secondary | ICD-10-CM | POA: Diagnosis not present

## 2016-01-21 DIAGNOSIS — Z79899 Other long term (current) drug therapy: Secondary | ICD-10-CM | POA: Diagnosis not present

## 2016-01-21 DIAGNOSIS — I10 Essential (primary) hypertension: Secondary | ICD-10-CM | POA: Diagnosis not present

## 2016-01-21 MED FILL — AMLODIPINE BESYLATE 5 MG TA: 5 | 90 days supply | Qty: 90 | Fill #0

## 2016-01-28 MED FILL — ROSUVASTATIN CALCIUM 20 MG: 20 | 90 days supply | Qty: 90 | Fill #1

## 2016-01-28 MED FILL — MELOXICAM 15 MG TABLET: 15 | 30 days supply | Qty: 30 | Fill #3

## 2016-02-19 MED FILL — VICTOZA 18 MG/3 ML INJECT P: 18 | 90 days supply | Qty: 27 | Fill #0

## 2016-02-24 MED FILL — MELOXICAM 15 MG TABLET: 15 | 30 days supply | Qty: 30 | Fill #4

## 2016-03-15 DIAGNOSIS — E1165 Type 2 diabetes mellitus with hyperglycemia: Secondary | ICD-10-CM | POA: Diagnosis not present

## 2016-03-15 DIAGNOSIS — I10 Essential (primary) hypertension: Secondary | ICD-10-CM | POA: Diagnosis not present

## 2016-03-15 DIAGNOSIS — Z79899 Other long term (current) drug therapy: Secondary | ICD-10-CM | POA: Diagnosis not present

## 2016-03-15 DIAGNOSIS — Z6829 Body mass index (BMI) 29.0-29.9, adult: Secondary | ICD-10-CM | POA: Diagnosis not present

## 2016-03-15 MED FILL — ACCU-CHEK FASTCLIX LANCETS: 90 days supply | Qty: 306 | Fill #0

## 2016-03-15 MED FILL — OLMESARTAN-HCTZ 40-25 MG TA: 40-25 | 90 days supply | Qty: 90 | Fill #0

## 2016-03-15 MED FILL — ACCU-CHEK GUIDE TEST STRIP: 90 days supply | Qty: 300 | Fill #0

## 2016-04-05 MED FILL — MELOXICAM 15 MG TABLET: 15 | 30 days supply | Qty: 30 | Fill #5

## 2016-04-21 MED FILL — PIOGLIT-METFORMIN 15-850: 15-850 | 90 days supply | Qty: 180 | Fill #2

## 2016-05-03 MED FILL — ROSUVASTATIN CALCIUM 20 MG: 20 | 90 days supply | Qty: 90 | Fill #2

## 2016-05-06 MED FILL — MELOXICAM 15 MG TABLET: 15 | 30 days supply | Qty: 30 | Fill #0

## 2016-05-19 MED FILL — VICTOZA 18 MG/3 ML INJECT P: 18 | 90 days supply | Qty: 27 | Fill #1

## 2016-06-06 MED FILL — MELOXICAM 15 MG TABLET: 15 | 30 days supply | Qty: 30 | Fill #1

## 2016-06-07 DIAGNOSIS — I10 Essential (primary) hypertension: Secondary | ICD-10-CM | POA: Diagnosis not present

## 2016-06-07 DIAGNOSIS — Z79899 Other long term (current) drug therapy: Secondary | ICD-10-CM | POA: Diagnosis not present

## 2016-06-07 DIAGNOSIS — E785 Hyperlipidemia, unspecified: Secondary | ICD-10-CM | POA: Diagnosis not present

## 2016-06-07 DIAGNOSIS — E1165 Type 2 diabetes mellitus with hyperglycemia: Secondary | ICD-10-CM | POA: Diagnosis not present

## 2016-06-24 MED FILL — OLMESARTAN-HCTZ 40-25 MG TA: 40-25 | 90 days supply | Qty: 90 | Fill #1

## 2016-07-01 MED FILL — AMLODIPINE BESYLATE 5 MG TA: 5 | 90 days supply | Qty: 90 | Fill #1

## 2016-07-05 MED FILL — MELOXICAM 15 MG TABLET: 15 | 30 days supply | Qty: 30 | Fill #2

## 2016-07-13 DIAGNOSIS — E119 Type 2 diabetes mellitus without complications: Secondary | ICD-10-CM | POA: Diagnosis not present

## 2016-07-19 ENCOUNTER — Ambulatory Visit: Payer: 59 | Admitting: *Deleted

## 2016-07-21 MED FILL — PIOGLIT-METFORMIN 15-850: 15-850 | 90 days supply | Qty: 180 | Fill #0

## 2016-08-01 MED FILL — MELOXICAM 15 MG TABLET: 15 | 30 days supply | Qty: 30 | Fill #0

## 2016-08-01 MED FILL — ROSUVASTATIN CALCIUM 20 MG: 20 | 90 days supply | Qty: 90 | Fill #0

## 2016-08-17 MED FILL — ACCU-CHEK FASTCLIX LANCETS: 90 days supply | Qty: 204 | Fill #1

## 2016-08-17 MED FILL — VICTOZA 18 MG/3 ML INJECT P: 18 | 90 days supply | Qty: 27 | Fill #2

## 2016-08-17 MED FILL — ACCU-CHEK GUIDE TEST STRIP: 90 days supply | Qty: 200 | Fill #1

## 2016-08-18 MED FILL — BD PEN NDL NANO 32GX5/32": 32G X 4 MM | 90 days supply | Qty: 100 | Fill #0

## 2016-08-18 MED FILL — BD PEN NDL NANO 32GX5/32: 32G X 4 MM | 90 days supply | Qty: 100 | Fill #0

## 2016-09-05 MED FILL — MELOXICAM 15 MG TABLET: 15 | 30 days supply | Qty: 30 | Fill #1

## 2016-09-13 DIAGNOSIS — H40053 Ocular hypertension, bilateral: Secondary | ICD-10-CM | POA: Diagnosis not present

## 2016-09-13 DIAGNOSIS — E1165 Type 2 diabetes mellitus with hyperglycemia: Secondary | ICD-10-CM | POA: Diagnosis not present

## 2016-09-13 DIAGNOSIS — Z0000 Encounter for general adult medical examination without abnormal findings: Secondary | ICD-10-CM | POA: Diagnosis not present

## 2016-09-13 DIAGNOSIS — E119 Type 2 diabetes mellitus without complications: Secondary | ICD-10-CM | POA: Diagnosis not present

## 2016-09-13 DIAGNOSIS — I10 Essential (primary) hypertension: Secondary | ICD-10-CM | POA: Diagnosis not present

## 2016-09-13 DIAGNOSIS — Z1212 Encounter for screening for malignant neoplasm of rectum: Secondary | ICD-10-CM | POA: Diagnosis not present

## 2016-09-13 DIAGNOSIS — B353 Tinea pedis: Secondary | ICD-10-CM | POA: Diagnosis not present

## 2016-09-22 MED FILL — OLMESARTAN-HCTZ 40-25 MG TA: 40-25 | 90 days supply | Qty: 90 | Fill #2

## 2016-09-22 MED FILL — AMLODIPINE BESYLATE 5 MG TA: 5 | 90 days supply | Qty: 90 | Fill #1

## 2016-09-30 MED FILL — SUPREP BOWEL PREP KIT: 17.5-3.13-1 | 1 days supply | Qty: 354 | Fill #0

## 2016-10-06 MED FILL — MELOXICAM 15 MG TABLET: 15 | 30 days supply | Qty: 30 | Fill #2

## 2016-10-18 DIAGNOSIS — Z1211 Encounter for screening for malignant neoplasm of colon: Secondary | ICD-10-CM | POA: Diagnosis not present

## 2016-10-18 DIAGNOSIS — D124 Benign neoplasm of descending colon: Secondary | ICD-10-CM | POA: Diagnosis not present

## 2016-10-18 DIAGNOSIS — K635 Polyp of colon: Secondary | ICD-10-CM | POA: Diagnosis not present

## 2016-10-18 DIAGNOSIS — D122 Benign neoplasm of ascending colon: Secondary | ICD-10-CM | POA: Diagnosis not present

## 2016-10-18 LAB — HM COLONOSCOPY

## 2016-10-21 MED FILL — ROSUVASTATIN CALCIUM 20 MG: 20 | 90 days supply | Qty: 90 | Fill #1

## 2016-10-21 MED FILL — PIOGLIT-METFORMIN 15-850: 15-850 | 90 days supply | Qty: 180 | Fill #1

## 2016-11-07 MED FILL — MELOXICAM 15 MG TABLET: 15 | 30 days supply | Qty: 30 | Fill #0

## 2016-11-07 MED FILL — VICTOZA 18 MG/3 ML INJECT P: 18 | 90 days supply | Qty: 27 | Fill #3

## 2016-12-14 DIAGNOSIS — E1165 Type 2 diabetes mellitus with hyperglycemia: Secondary | ICD-10-CM | POA: Diagnosis not present

## 2016-12-14 DIAGNOSIS — Z1389 Encounter for screening for other disorder: Secondary | ICD-10-CM | POA: Diagnosis not present

## 2016-12-14 DIAGNOSIS — Z79899 Other long term (current) drug therapy: Secondary | ICD-10-CM | POA: Diagnosis not present

## 2016-12-14 DIAGNOSIS — I10 Essential (primary) hypertension: Secondary | ICD-10-CM | POA: Diagnosis not present

## 2016-12-14 DIAGNOSIS — E785 Hyperlipidemia, unspecified: Secondary | ICD-10-CM | POA: Diagnosis not present

## 2016-12-19 MED FILL — AMLODIPINE BESYLATE 5 MG TA: 5 | 90 days supply | Qty: 90 | Fill #0

## 2017-01-02 MED FILL — HYDROCHLOROTHIAZIDE 25 MG T: 25 | 90 days supply | Qty: 90 | Fill #0

## 2017-01-02 MED FILL — OLMESARTAN MEDOXOMIL 40 MG: 40 | 90 days supply | Qty: 90 | Fill #0

## 2017-01-02 MED FILL — MELOXICAM 15 MG TABLET: 15 | 30 days supply | Qty: 30 | Fill #1

## 2017-01-04 MED FILL — ACCU-CHEK FASTCLIX LANCETS: 90 days supply | Qty: 204 | Fill #2

## 2017-01-04 MED FILL — BD PEN NDL NANO 32GX5/32": 32G X 4 MM | 90 days supply | Qty: 100 | Fill #1

## 2017-01-04 MED FILL — ACCU-CHEK GUIDE TEST STRIP: 90 days supply | Qty: 200 | Fill #2

## 2017-01-04 MED FILL — BD PEN NDL NANO 32GX5/32: 32G X 4 MM | 90 days supply | Qty: 100 | Fill #1

## 2017-01-19 MED FILL — PIOGLIT-METFORMIN 15-850: 15-850 | 90 days supply | Qty: 180 | Fill #2

## 2017-01-30 MED FILL — OZEMPIC 0.25 OR 0.5 MG/DOSE: 2 | 28 days supply | Qty: 2 | Fill #0

## 2017-01-31 DIAGNOSIS — E1165 Type 2 diabetes mellitus with hyperglycemia: Secondary | ICD-10-CM | POA: Diagnosis not present

## 2017-01-31 DIAGNOSIS — Z7189 Other specified counseling: Secondary | ICD-10-CM | POA: Diagnosis not present

## 2017-01-31 DIAGNOSIS — I10 Essential (primary) hypertension: Secondary | ICD-10-CM | POA: Diagnosis not present

## 2017-01-31 MED FILL — MELOXICAM 15 MG TABLET: 15 | 30 days supply | Qty: 30 | Fill #2

## 2017-01-31 MED FILL — ROSUVASTATIN CALCIUM 20 MG: 20 | 90 days supply | Qty: 90 | Fill #2

## 2017-03-06 MED FILL — OSELTAMIVIR PHOSPHATE 75 MG: 75 | 5 days supply | Qty: 10 | Fill #0

## 2017-03-14 MED FILL — MELOXICAM 15 MG TABLET: 15 | 30 days supply | Qty: 30 | Fill #0

## 2017-03-20 MED FILL — AMLODIPINE BESYLATE 5 MG TA: 5 | 90 days supply | Qty: 90 | Fill #1

## 2017-03-21 MED FILL — OZEMPIC 0.25 OR 0.5 MG/DOSE: 2 | 28 days supply | Qty: 2 | Fill #1

## 2017-03-21 MED FILL — OLMESARTAN-HCTZ 40-25 MG TA: 40-25 | 30 days supply | Qty: 30 | Fill #0

## 2017-03-28 DIAGNOSIS — Z79899 Other long term (current) drug therapy: Secondary | ICD-10-CM | POA: Diagnosis not present

## 2017-03-28 DIAGNOSIS — E1165 Type 2 diabetes mellitus with hyperglycemia: Secondary | ICD-10-CM | POA: Diagnosis not present

## 2017-03-28 DIAGNOSIS — Z6829 Body mass index (BMI) 29.0-29.9, adult: Secondary | ICD-10-CM | POA: Diagnosis not present

## 2017-03-28 DIAGNOSIS — I10 Essential (primary) hypertension: Secondary | ICD-10-CM | POA: Diagnosis not present

## 2017-03-28 MED FILL — ACCU-CHEK GUIDE STRP: 90 days supply | Qty: 200 | Fill #0

## 2017-03-28 MED FILL — ACCU-CHEK FASTCLIX LANCETS: 90 days supply | Qty: 204 | Fill #0

## 2017-04-11 MED FILL — MELOXICAM 15 MG TABLET: 15 | 30 days supply | Qty: 30 | Fill #1

## 2017-04-12 MED FILL — OZEMPIC 0.25 OR 0.5 MG/DOSE: 2 | 28 days supply | Qty: 2 | Fill #0

## 2017-04-20 MED FILL — PIOGLIT-METFORMIN 15-850: 15-850 | 90 days supply | Qty: 180 | Fill #0

## 2017-04-22 IMAGING — DX DG FINGER LITTLE 2+V*L*
3 series · 3 of 3 positions shown · non-contrast
Comparison: None.

CLINICAL DATA: Pain and swelling in the left small finger for 4
days. Evaluation for osteomyelitis.

EXAM:
LEFT LITTLE FINGER 2+V

[finger ap]
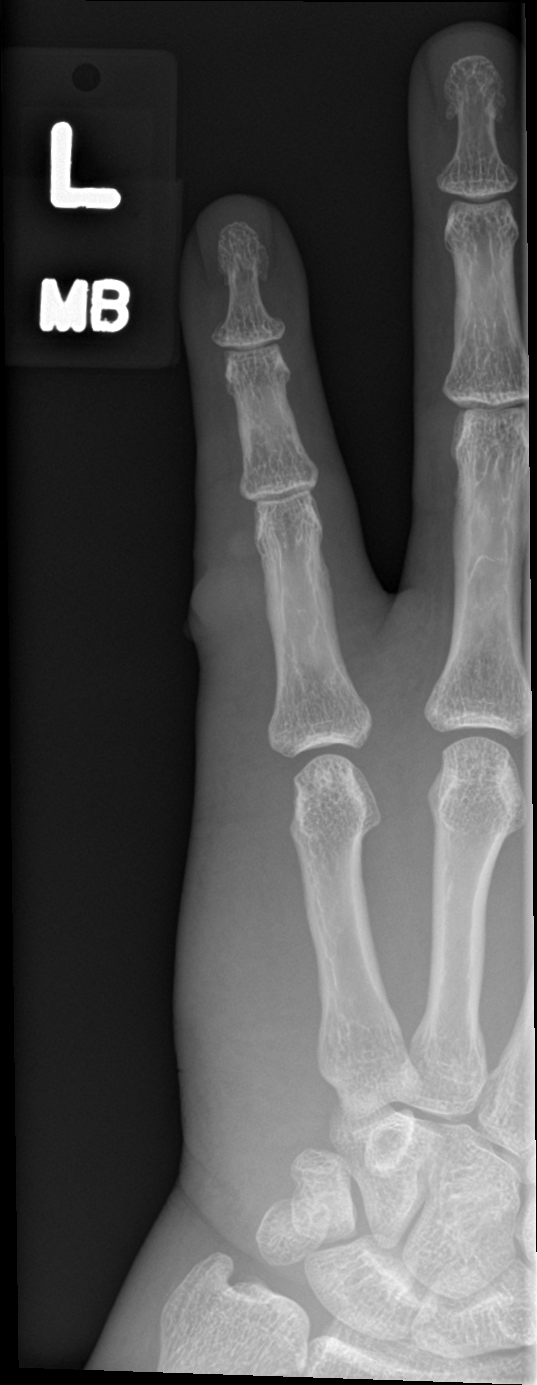

[finger obl]
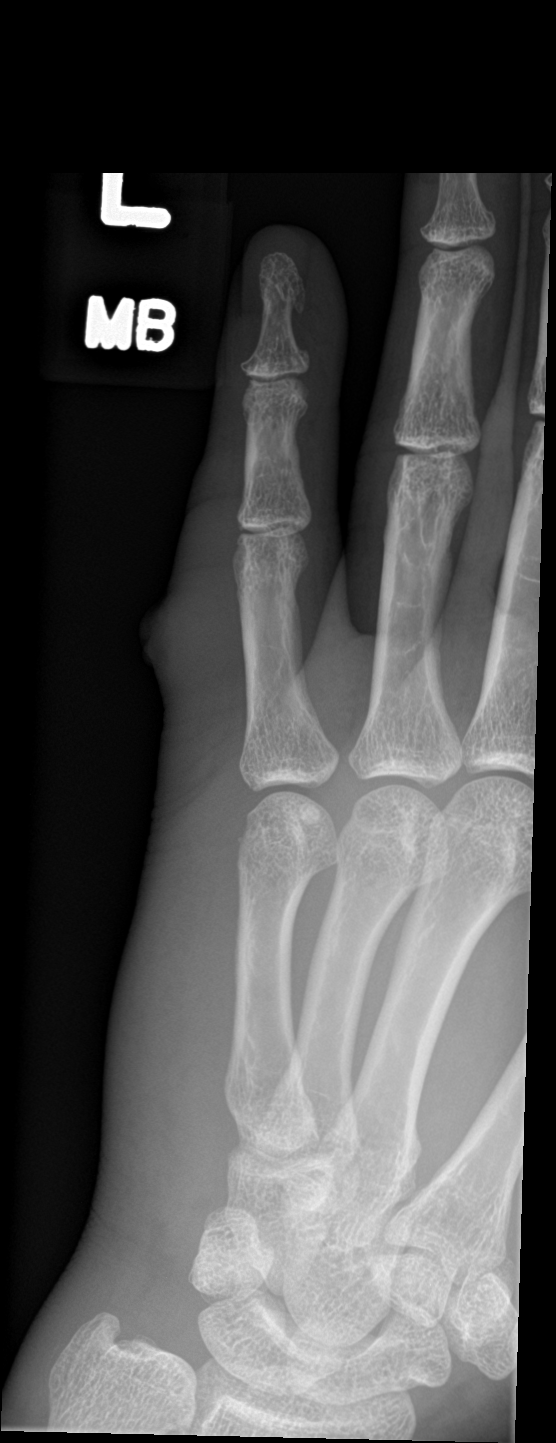

[finger lat]
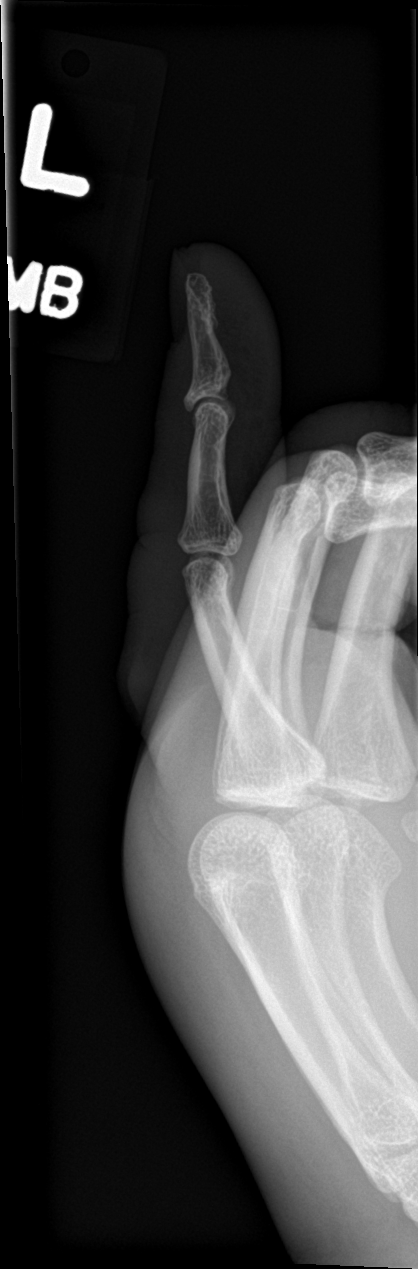

[3 of 3 positions shown; findings below may reference images not displayed]

FINDINGS: There is focal soft tissue swelling along the ulnar aspect of the
proximal small finger compatible with provided history. No
underlying osseous erosion, fracture, or dislocation is seen.
Prominent soft tissue swelling extends more proximally in the ulnar
aspect of the hand and along the dorsum of the hand. No soft tissue
emphysema radiopaque foreign body is seen.
IMPRESSION: Soft tissue swelling without evidence of acute osseous abnormality.

## 2017-04-24 MED FILL — OLMESARTAN-HCTZ 40-25 MG TA: 40-25 | 90 days supply | Qty: 90 | Fill #0

## 2017-05-08 MED FILL — OZEMPIC 0.25 OR 0.5 MG/DOSE: 2 | 28 days supply | Qty: 2 | Fill #1

## 2017-05-09 MED FILL — ROSUVASTATIN CALCIUM 20 MG: 20 | 90 days supply | Qty: 90 | Fill #0

## 2017-05-09 MED FILL — MELOXICAM 15 MG TABLET: 15 | 30 days supply | Qty: 30 | Fill #2

## 2017-06-09 MED FILL — OZEMPIC 0.25 OR 0.5 MG/DOSE: 2 | 28 days supply | Qty: 2 | Fill #2

## 2017-06-09 MED FILL — MELOXICAM 15 MG TABLET: 15 | 30 days supply | Qty: 30 | Fill #0

## 2017-06-19 MED FILL — AMLODIPINE BESYLATE 5 MG TA: 5 | 90 days supply | Qty: 90 | Fill #2

## 2017-06-27 DIAGNOSIS — I10 Essential (primary) hypertension: Secondary | ICD-10-CM | POA: Diagnosis not present

## 2017-06-27 DIAGNOSIS — E1165 Type 2 diabetes mellitus with hyperglycemia: Secondary | ICD-10-CM | POA: Diagnosis not present

## 2017-06-27 DIAGNOSIS — Z6829 Body mass index (BMI) 29.0-29.9, adult: Secondary | ICD-10-CM | POA: Diagnosis not present

## 2017-06-27 DIAGNOSIS — E663 Overweight: Secondary | ICD-10-CM | POA: Diagnosis not present

## 2017-06-28 MED FILL — OZEMPIC 1 MG/DOSE SOPN: 2 | 28 days supply | Qty: 3 | Fill #0

## 2017-07-11 DIAGNOSIS — I10 Essential (primary) hypertension: Secondary | ICD-10-CM | POA: Insufficient documentation

## 2017-07-11 LAB — HEPATIC FUNCTION PANEL
ALT: 18 (ref 10–40)
AST: 21 (ref 14–40)
Alkaline Phosphatase: 78 (ref 25–125)
Bilirubin, Total: 0.6

## 2017-07-11 LAB — BASIC METABOLIC PANEL
BUN: 16 (ref 4–21)
Creatinine: 0.9 (ref 0.6–1.3)
Glucose: 140
Potassium: 4.1 (ref 3.4–5.3)
Sodium: 139 (ref 137–147)

## 2017-07-11 LAB — HEMOGLOBIN A1C: Hemoglobin A1C: 7.9

## 2017-07-12 MED FILL — MELOXICAM 15 MG TABLET: 15 | 30 days supply | Qty: 30 | Fill #1

## 2017-07-18 MED FILL — PIOGLIT-METFORMIN 15-850: 15-850 | 90 days supply | Qty: 180 | Fill #0

## 2017-07-18 MED FILL — OLMESARTAN-HCTZ 40-25 MG TA: 40-25 | 90 days supply | Qty: 90 | Fill #1

## 2017-07-31 MED FILL — ROSUVASTATIN CALCIUM 20 MG: 20 | 90 days supply | Qty: 90 | Fill #1

## 2017-08-04 MED FILL — OZEMPIC 1 MG/DOSE SOPN: 2 | 28 days supply | Qty: 3 | Fill #0

## 2017-08-11 MED FILL — MELOXICAM 15 MG TABLET: 15 | 30 days supply | Qty: 30 | Fill #0

## 2017-08-29 DIAGNOSIS — H40013 Open angle with borderline findings, low risk, bilateral: Secondary | ICD-10-CM | POA: Diagnosis not present

## 2017-08-29 DIAGNOSIS — E119 Type 2 diabetes mellitus without complications: Secondary | ICD-10-CM | POA: Diagnosis not present

## 2017-08-29 LAB — HM DIABETES EYE EXAM

## 2017-08-30 MED FILL — OZEMPIC 1 MG/DOSE SOPN: 2 | 28 days supply | Qty: 3 | Fill #0

## 2017-08-30 MED FILL — ACCU-CHEK GUIDE STRP: 90 days supply | Qty: 200 | Fill #1

## 2017-09-13 MED FILL — MELOXICAM 15 MG TABLET: 15 | 30 days supply | Qty: 30 | Fill #1

## 2017-09-14 MED FILL — AMLODIPINE BESYLATE 5 MG TA: 5 | 90 days supply | Qty: 90 | Fill #0

## 2017-09-27 MED FILL — OZEMPIC 1 MG/DOSE SOPN: 2 | 28 days supply | Qty: 3 | Fill #1

## 2017-10-12 MED FILL — PIOGLIT-METFORMIN 15-850: 15-850 | 90 days supply | Qty: 180 | Fill #1

## 2017-10-12 MED FILL — BD PEN NDL NANO 32GX5/32: 32G X 4 MM | 90 days supply | Qty: 100 | Fill #0

## 2017-10-12 MED FILL — OLMESARTAN-HCTZ 40-25 MG TA: 40-25 | 90 days supply | Qty: 90 | Fill #1

## 2017-10-12 MED FILL — BD PEN NDL NANO 32GX5/32": 32G X 4 MM | 90 days supply | Qty: 100 | Fill #0

## 2017-10-18 ENCOUNTER — Other Ambulatory Visit: Payer: Self-pay | Admitting: Internal Medicine

## 2017-10-18 MED FILL — MELOXICAM 15 MG TABLET: 15 | 30 days supply | Qty: 30 | Fill #0

## 2017-10-25 MED FILL — OZEMPIC 1 MG/DOSE SOPN: 2 | 28 days supply | Qty: 3 | Fill #2

## 2017-10-29 ENCOUNTER — Encounter: Payer: Self-pay | Admitting: Internal Medicine

## 2017-10-29 DIAGNOSIS — I10 Essential (primary) hypertension: Secondary | ICD-10-CM

## 2017-10-29 DIAGNOSIS — E1165 Type 2 diabetes mellitus with hyperglycemia: Secondary | ICD-10-CM

## 2017-10-31 ENCOUNTER — Encounter: Payer: Self-pay | Admitting: Internal Medicine

## 2017-10-31 ENCOUNTER — Ambulatory Visit: Payer: 59 | Admitting: Internal Medicine

## 2017-10-31 VITALS — BP 146/90 | HR 86 | Temp 98.0°F | Ht 65.0 in | Wt 170.0 lb

## 2017-10-31 DIAGNOSIS — I10 Essential (primary) hypertension: Secondary | ICD-10-CM | POA: Diagnosis not present

## 2017-10-31 DIAGNOSIS — Z7982 Long term (current) use of aspirin: Secondary | ICD-10-CM

## 2017-10-31 DIAGNOSIS — Z1212 Encounter for screening for malignant neoplasm of rectum: Secondary | ICD-10-CM | POA: Diagnosis not present

## 2017-10-31 DIAGNOSIS — E1165 Type 2 diabetes mellitus with hyperglycemia: Secondary | ICD-10-CM

## 2017-10-31 DIAGNOSIS — Z0000 Encounter for general adult medical examination without abnormal findings: Secondary | ICD-10-CM

## 2017-10-31 LAB — POCT URINALYSIS DIPSTICK
Bilirubin, UA: NEGATIVE
Blood, UA: NEGATIVE
Glucose, UA: NEGATIVE
Ketones, UA: NEGATIVE
Leukocytes, UA: NEGATIVE
Nitrite, UA: NEGATIVE
Protein, UA: NEGATIVE
Spec Grav, UA: 1.015 (ref 1.010–1.025)
Urobilinogen, UA: 0.2 E.U./dL
pH, UA: 7 (ref 5.0–8.0)

## 2017-10-31 LAB — POCT UA - MICROALBUMIN
Albumin/Creatinine Ratio, Urine, POC: <30
Creatinine, POC: 300 mg/dL
Microalbumin Ur, POC: 30 mg/L

## 2017-10-31 MED ORDER — PNEUMOCOCCAL 13-VAL CONJ VACC IM SUSP: 0.5000 mL | INTRAMUSCULAR | 0 refills | Status: DC

## 2017-10-31 NOTE — Progress Notes (Signed)
Men's preventive visit. Patient Health Questionnaire (PHQ-2) is    Office Visit from 10/31/2017 in [facility]  PHQ-2 Total Score  0    . Patient is on a low-glycemic diet. Marital status: Married. Relevant history for alcohol use is:  Social History   Substance and Sexual Activity  Alcohol Use No  . Relevant history for tobacco use is:  Social History   Tobacco Use  Smoking Status Never Smoker  Smokeless Tobacco Never Used    Subjective:     Patient ID: Phillip Armstrong , male    DOB: 08/13/1965 , 52 y.o.   MRN: [id number]   HE IS HERE TODAY FOR A FULL PHYSICAL EXAMINATION. HE HAS NO SPECIFIC CONCERNS OR COMPLAINTS AT THIS TIME. HE HAS ALREADY RECEIVED THE FLU VACCINE.   Hypertension  This is a chronic problem. The current episode started more than 1 year ago. The problem has been rapidly improving since onset. The problem is controlled. Pertinent negatives include no blurred vision, chest pain, headaches, malaise/fatigue, neck pain or shortness of breath. Risk factors for coronary artery disease include diabetes mellitus and male gender. There are no compliance problems.   Diabetes  He presents for his follow-up diabetic visit. He has type 2 diabetes mellitus. Pertinent negatives for hypoglycemia include no headaches. Pertinent negatives for diabetes include no blurred vision and no chest pain. Risk factors for coronary artery disease include diabetes mellitus and hypertension. He is following a diabetic diet. When asked about meal planning, he reported none. He participates in exercise intermittently. His breakfast blood glucose is taken between 8-9 am. His breakfast blood glucose range is generally 110-130 mg/dl. Eye exam is current.     Past Medical History:  Diagnosis Date  . Diabetes mellitus 2000   T2DM  . Hyperlipidemia   . Hypertension       Current Outpatient Medications:  .  amLODipine (NORVASC) 5 MG tablet, Take 1 tablet (5 mg total) by mouth  daily., Disp: 30 tablet, Rfl: 1 .  aspirin 81 MG tablet, Take 81 mg by mouth daily. , Disp: , Rfl:  .  clindamycin (CLEOCIN) 300 MG capsule, Take 1 capsule (300 mg total) by mouth 3 (three) times daily., Disp: 21 capsule, Rfl: 0 .  glucose blood (ACCU-CHEK GUIDE) test strip, 1 each by Other route as needed for other. Use as directed to check blood sugars 2 times per day dx: e11.65, Disp: , Rfl:  .  Lancets Misc. (ACCU-CHEK FASTCLIX LANCET) KIT, by Does not apply route., Disp: , Rfl:  .  meloxicam (MOBIC) 15 MG tablet, TAKE 1 TABLET BY MOUTH EVERY DAY AS NEEDED FOR KNEE PAIN, Disp: 30 tablet, Rfl: 1 .  Multiple Vitamins-Minerals (MULTIVITAMIN WITH MINERALS) tablet, Take 1 tablet by mouth daily., Disp: , Rfl:  .  nystatin (MYCOSTATIN) powder, Apply 1 g topically 2 (two) times daily as needed. Reported on 02/02/2015, Disp: , Rfl:  .  olmesartan-hydrochlorothiazide (BENICAR HCT) 40-25 MG per tablet, Take 1 tablet by mouth daily.  , Disp: , Rfl:  .  pioglitazone-metformin (ACTOPLUS MET) 15-850 MG per tablet, Take 1 tablet by mouth 2 (two) times daily with a meal.  , Disp: , Rfl:  .  rosuvastatin (CRESTOR) 20 MG tablet, Take 20 mg by mouth daily.  , Disp: , Rfl:  .  Semaglutide,0.25 or 0.'5MG'$ /DOS, (OZEMPIC, 0.25 OR 0.5 MG/DOSE,) 2 MG/1.5ML SOPN, Inject into the skin., Disp: , Rfl:    No Known Allergies   Review of Systems  Constitutional: Negative.  Negative for malaise/fatigue.  HENT: Negative.   Eyes: Negative.  Negative for blurred vision.  Respiratory: Negative.  Negative for shortness of breath.   Cardiovascular: Negative.  Negative for chest pain.  Gastrointestinal: Negative.   Genitourinary: Negative.   Musculoskeletal: Negative.  Negative for neck pain.  Neurological: Negative.  Negative for headaches.  Psychiatric/Behavioral: Negative.      Today's Vitals   10/31/17 0858  BP: (!) 146/90  Pulse: 86  Temp: 98 F (36.7 C)  TempSrc: Oral  Weight: 170 lb (77.1 kg)  Height: '5\' 5"'$   (1.651 m)   Body mass index is 28.29 kg/m.   Objective:  Physical Exam  Constitutional: He is oriented to person, place, and time. He appears well-developed.  HENT:  Head: Normocephalic and atraumatic.  Right Ear: External ear normal.  Left Ear: External ear normal.  Nose: Nose normal.  Mouth/Throat: Oropharynx is clear and moist.  Eyes: Pupils are equal, round, and reactive to light. Conjunctivae and EOM are normal.  Neck: Normal range of motion. Neck supple.  Cardiovascular: Normal rate, regular rhythm and normal heart sounds.  Pulses:      Dorsalis pedis pulses are 3+ on the right side, and 3+ on the left side.  Pulmonary/Chest: Effort normal and breath sounds normal.  Abdominal: Soft. Bowel sounds are normal. He exhibits no mass. There is no tenderness. There is no guarding.  Genitourinary: Rectum normal, prostate normal and penis normal.  Musculoskeletal: Normal range of motion.       Right foot: There is normal range of motion and no deformity.       Left foot: There is normal range of motion and no deformity.  Feet:  Right Foot:  Protective Sensation: 5 sites tested. 5 sites sensed.  Skin Integrity: Negative for ulcer, blister, skin breakdown, erythema or warmth.  Left Foot:  Protective Sensation: 5 sites tested. 5 sites sensed.  Skin Integrity: Negative for ulcer, blister, skin breakdown, erythema or warmth.  Neurological: He is alert and oriented to person, place, and time.  Skin: Skin is warm and dry.  Psychiatric: He has a normal mood and affect.  Nursing note and vitals reviewed.       Assessment And Plan:     1. Routine general medical examination at health care facility  A FULL EXAM WAS PERFORMED. DRE PERFORMED, STOOL HEME NEGATIVE.  PATIENT HAS BEEN ADVISED TO GET 30-45 MINUTES REGULAR EXERCISE NO LESS THAN FOUR TO FIVE DAYS PER WEEK - BOTH WEIGHTBEARING EXERCISES AND AEROBIC ARE RECOMMENDED.  HE IS ADVISED TO FOLLOW A HEALTHY DIET WITH AT LEAST SIX  FRUITS/VEGGIES PER DAY, DECREASE INTAKE OF RED MEAT, AND TO INCREASE FISH INTAKE TO TWO DAYS PER WEEK.  MEATS/FISH SHOULD NOT BE FRIED, BAKED OR BROILED IS PREFERABLE.  I SUGGEST WEARING SPF 50 SUNSCREEN ON EXPOSED PARTS AND ESPECIALLY WHEN IN THE DIRECT SUNLIGHT FOR AN EXTENDED PERIOD OF TIME.  PLEASE AVOID FAST FOOD RESTAURANTS AND INCREASE YOUR WATER INTAKE.  2. Primary hypertension  FAIR CONTROL.  HE WILL CONTINUE WITH CURRENT MEDICATIONS FOR NOW. EKG PERFORMED, NSR W/O ACUTE CHANGES. HE IS ENCOURAGED TO AVOID ADDING SALT TO HIS FOODS. HE WILL RTO IN 3 MONTHS FOR RE-EVALUATION.   - EKG 12-Lead  3. Uncontrolled type 2 diabetes mellitus with hyperglycemia (HCC)  DIABETIC FOOT EXAM WAS PERFORMED.  IT IS IMPORTANT TO MAINTAIN OPTIMAL BLOOD SUGAR CONTROL TO REDUCE YOUR RISK OF SERIOUS DIABETES COMPLICATIONS INCLUDING EYE AND KIDNEY DISEASE.  BLOOD SUGARS SHOULD  BE LESS THAN 110 BEFORE MEALS AND LESS THAN 140 TWO HOURS AFTER MEALS.  BRING YOUR BLOOD SUGAR READINGS FOR REVIEW.  PLEASE MAINTAIN A HEALTHY, WELL BALANCED DIET FREE OF PROCESSED FOODS AND REFINED SUGARS.  MAINTAIN YOUR REGULAR EXERCISE REGIMEN.  PLS NOTIFY OFFICE FOR UNCONTROLLED NAUSEA, VOMITING, DIARRHEA, POOR ORAL INTAKE AND FOR CONSISTENT BS READINGS ABOVE 350.   [name], MD

## 2017-11-01 LAB — CMP14+EGFR
ALT: 16 IU/L (ref 0–44)
AST: 20 IU/L (ref 0–40)
Albumin/Globulin Ratio: 1.9 (ref 1.2–2.2)
Albumin: 4.8 g/dL (ref 3.5–5.5)
Alkaline Phosphatase: 73 IU/L (ref 39–117)
BUN/Creatinine Ratio: 17 (ref 9–20)
BUN: 15 mg/dL (ref 6–24)
Bilirubin Total: 0.6 mg/dL (ref 0.0–1.2)
CO2: 27 mmol/L (ref 20–29)
Calcium: 9.9 mg/dL (ref 8.7–10.2)
Chloride: 98 mmol/L (ref 96–106)
Creatinine, Ser: 0.89 mg/dL (ref 0.76–1.27)
GFR calc Af Amer: 114 mL/min/{1.73_m2} (ref >59)
GFR calc non Af Amer: 99 mL/min/{1.73_m2} (ref >59)
Globulin, Total: 2.5 g/dL (ref 1.5–4.5)
Glucose: 130 mg/dL — ABNORMAL HIGH (ref 65–99)
Potassium: 3.9 mmol/L (ref 3.5–5.2)
Sodium: 141 mmol/L (ref 134–144)
Total Protein: 7.3 g/dL (ref 6.0–8.5)

## 2017-11-01 LAB — HEMOGLOBIN A1C
Est. average glucose Bld gHb Est-mCnc: 157 mg/dL
Hgb A1c MFr Bld: 7.1 % — ABNORMAL HIGH (ref 4.8–5.6)

## 2017-11-01 LAB — LIPID PANEL
Chol/HDL Ratio: 3.1 ratio (ref 0.0–5.0)
Cholesterol, Total: 120 mg/dL (ref 100–199)
HDL: 39 mg/dL — ABNORMAL LOW (ref >39)
LDL Calculated: 65 mg/dL (ref 0–99)
Triglycerides: 82 mg/dL (ref 0–149)
VLDL Cholesterol Cal: 16 mg/dL (ref 5–40)

## 2017-11-01 LAB — CBC
Hematocrit: 39.9 % (ref 37.5–51.0)
Hemoglobin: 13.3 g/dL (ref 13.0–17.7)
MCH: 26.1 pg — ABNORMAL LOW (ref 26.6–33.0)
MCHC: 33.3 g/dL (ref 31.5–35.7)
MCV: 78 fL — ABNORMAL LOW (ref 79–97)
Platelets: 320 10*3/uL (ref 150–450)
RBC: 5.1 x10E6/uL (ref 4.14–5.80)
RDW: 13.3 % (ref 12.3–15.4)
WBC: 5.2 10*3/uL (ref 3.4–10.8)

## 2017-11-01 LAB — PSA: Prostate Specific Ag, Serum: 0.3 ng/mL (ref 0.0–4.0)

## 2017-11-01 NOTE — Progress Notes (Signed)
Here are your lab results:  Your blood count is normal.   Your liver and kidney function are normal.  Your cholesterol looks pretty good. Your HDL is low, this is your good cholesterol. Please increase your exercise to no less than five days weekly.  I would also cut back on intake of sugary foods.  Your hba1c is 7.1, down from 7.9.  Congratulations!  Keep up the great work!  Your PSA, prostate test, is within normal limits. Please let me know if you have any questions.   Sincerely,    Kamal Jurgens N. [facility], MD

## 2017-11-17 MED FILL — MELOXICAM 15 MG TABLET: 15 | 30 days supply | Qty: 30 | Fill #1

## 2017-11-22 ENCOUNTER — Other Ambulatory Visit: Payer: Self-pay | Admitting: Internal Medicine

## 2017-11-22 MED FILL — OZEMPIC 1 MG/DOSE SOPN: 2 | 28 days supply | Qty: 3 | Fill #0

## 2017-12-12 ENCOUNTER — Other Ambulatory Visit: Payer: Self-pay | Admitting: Internal Medicine

## 2017-12-12 MED FILL — MELOXICAM 15 MG TABLET: 15 | 30 days supply | Qty: 30 | Fill #0

## 2017-12-12 MED FILL — ROSUVASTATIN CALCIUM 20 MG: 20 | 90 days supply | Qty: 90 | Fill #2

## 2017-12-20 MED FILL — AMLODIPINE BESYLATE 5 MG TA: 5 | 90 days supply | Qty: 90 | Fill #1

## 2017-12-20 MED FILL — OZEMPIC 1 MG/DOSE SOPN: 2 | 28 days supply | Qty: 3 | Fill #1

## 2018-01-11 MED FILL — OZEMPIC 1 MG/DOSE SOPN: 2 | 28 days supply | Qty: 3 | Fill #2

## 2018-01-11 MED FILL — PIOGLIT-METFORMIN 15-850: 15-850 | 90 days supply | Qty: 180 | Fill #2

## 2018-01-11 MED FILL — OLMESARTAN-HCTZ 40-25 MG TA: 40-25 | 60 days supply | Qty: 60 | Fill #2

## 2018-01-11 MED FILL — MELOXICAM 15 MG TABLET: 15 | 30 days supply | Qty: 30 | Fill #1

## 2018-01-23 MED FILL — ACCU-CHEK GUIDE STRP: 90 days supply | Qty: 200 | Fill #2

## 2018-02-13 ENCOUNTER — Other Ambulatory Visit: Payer: Self-pay | Admitting: Internal Medicine

## 2018-02-13 MED FILL — MELOXICAM 15 MG TABLET: 15 | 30 days supply | Qty: 30 | Fill #0

## 2018-02-13 MED FILL — OZEMPIC 1 MG/DOSE SOPN: 2 | 28 days supply | Qty: 3 | Fill #0

## 2018-02-16 ENCOUNTER — Encounter: Payer: Self-pay | Admitting: Nurse Practitioner

## 2018-02-16 ENCOUNTER — Ambulatory Visit: Payer: 59 | Admitting: Nurse Practitioner

## 2018-02-16 VITALS — BP 146/98 | HR 96 | Temp 98.2°F | Ht 65.4 in | Wt 172.6 lb

## 2018-02-16 DIAGNOSIS — I10 Essential (primary) hypertension: Secondary | ICD-10-CM | POA: Diagnosis not present

## 2018-02-16 DIAGNOSIS — E1165 Type 2 diabetes mellitus with hyperglycemia: Secondary | ICD-10-CM | POA: Diagnosis not present

## 2018-02-16 MED ORDER — AMLODIPINE BESYLATE 10 MG PO TABS: 10.0000 mg | ORAL_TABLET | Freq: Every day | ORAL | 1 refills | Status: DC

## 2018-02-16 MED FILL — AMLODIPINE BESYLATE 10 MG T: 10 | 90 days supply | Qty: 90 | Fill #0

## 2018-02-16 NOTE — Progress Notes (Signed)
Subjective:     Patient ID: Phillip Armstrong , male    DOB: [birth year] , 53 y.o.   MRN: [id number]   Chief Complaint  Patient presents with  . Hypertension    HPI  He took his blood pressure twice yesterday due being up to 170/104  Hypertension  This is a chronic problem. The current episode started more than 1 year ago. The problem is unchanged. The problem is uncontrolled. Pertinent negatives include no anxiety, blurred vision, chest pain, headaches or malaise/fatigue. There are no associated agents to hypertension. Risk factors for coronary artery disease include sedentary lifestyle. Past treatments include angiotensin blockers and diuretics. There are no compliance problems.  There is no history of angina or kidney disease. There is no history of a hypertension causing med.  Diabetes  He presents for his follow-up diabetic visit. He has type 2 diabetes mellitus. There are no hypoglycemic associated symptoms. Pertinent negatives for hypoglycemia include no headaches. Pertinent negatives for diabetes include no blurred vision and no chest pain. There are no diabetic complications. There are no known risk factors for coronary artery disease. He is compliant with treatment all of the time. He has not had a previous visit with a dietitian. He rarely participates in exercise. There is no change in his home blood glucose trend. An ACE inhibitor/angiotensin II receptor blocker is being taken. Eye exam is not current.     Past Medical History:  Diagnosis Date  . Diabetes mellitus 2000   T2DM  . Hyperlipidemia   . Hypertension      Family History  Problem Relation Age of Onset  . Diabetes Son        T1DM  . Hypertension Other      Current Outpatient Medications:  .  amLODipine (NORVASC) 5 MG tablet, Take 1 tablet (5 mg total) by mouth daily., Disp: 30 tablet, Rfl: 1 .  aspirin 81 MG tablet, Take 81 mg by mouth daily. , Disp: , Rfl:  .  glucose blood (ACCU-CHEK GUIDE) test strip, 1  each by Other route as needed for other. Use as directed to check blood sugars 2 times per day dx: e11.65, Disp: , Rfl:  .  Lancets Misc. (ACCU-CHEK FASTCLIX LANCET) KIT, by Does not apply route., Disp: , Rfl:  .  meloxicam (MOBIC) 15 MG tablet, TAKE 1 TABLET BY MOUTH DAILY AS NEEDED FOR KNEE PAIN, Disp: 30 tablet, Rfl: 1 .  Multiple Vitamins-Minerals (MULTIVITAMIN WITH MINERALS) tablet, Take 1 tablet by mouth daily., Disp: , Rfl:  .  nystatin (MYCOSTATIN) powder, Apply 1 g topically 2 (two) times daily as needed. Reported on 02/02/2015, Disp: , Rfl:  .  olmesartan-hydrochlorothiazide (BENICAR HCT) 40-25 MG per tablet, Take 1 tablet by mouth daily.  , Disp: , Rfl:  .  OZEMPIC, 1 MG/DOSE, 2 MG/1.5ML SOPN, INJECT 1 MG SUBCUTANEOUSLY EVERY WEEK ON THE SAME DAY OF EACH WEEK, IN THE ABDOMEN, THIGHS, OR UPPER ARM ROTATING INJECTION SITES, Disp: 3 mL, Rfl: 2 .  pioglitazone-metformin (ACTOPLUS MET) 15-850 MG per tablet, Take 1 tablet by mouth 2 (two) times daily with a meal.  , Disp: , Rfl:  .  rosuvastatin (CRESTOR) 20 MG tablet, Take 20 mg by mouth daily.  , Disp: , Rfl:  .  Semaglutide,0.25 or 0.'5MG'$ /DOS, (OZEMPIC, 0.25 OR 0.5 MG/DOSE,) 2 MG/1.5ML SOPN, Inject into the skin., Disp: , Rfl:    No Known Allergies   Review of Systems  Constitutional: Negative for malaise/fatigue.  Eyes: Negative for blurred vision.  Cardiovascular: Negative for chest pain.  Neurological: Negative for headaches.     Today's Vitals   02/16/18 1210  BP: (!) 146/98  Pulse: 96  Temp: 98.2 F (36.8 C)  TempSrc: Oral  SpO2: 98%  Weight: 172 lb 9.6 oz (78.3 kg)  Height: 5' 5.4" (1.661 m)  PainSc: 0-No pain   Body mass index is 28.37 kg/m.   Objective:  Physical Exam Vitals signs reviewed.  Constitutional:      Appearance: Normal appearance.  Cardiovascular:     Rate and Rhythm: Normal rate and regular rhythm.     Pulses: Normal pulses.     Heart sounds: Normal heart sounds. No murmur.  Pulmonary:      Effort: Pulmonary effort is normal. No respiratory distress.     Breath sounds: Normal breath sounds.  Neurological:     General: No focal deficit present.     Mental Status: He is alert and oriented to person, place, and time.  Psychiatric:        Mood and Affect: Mood normal.        Behavior: Behavior normal.        Thought Content: Thought content normal.        Judgment: Judgment normal.         Assessment And Plan:     1. Essential hypertension . B/P is fairly controlled and has been getting worse where he had a spike last night. Marland Kitchen BMP ordered to check renal function.  . We will increase his amlodipine to '10mg'$  daily . The importance of regular exercise and dietary modification was stressed to the patient.  - amLODipine (NORVASC) 10 MG tablet; Take 1 tablet (10 mg total) by mouth daily.  Dispense: 90 tablet; Refill: 1  2. Uncontrolled type 2 diabetes mellitus with hyperglycemia (HCC) Chronic, controlled Continue with current medications Encouraged to limit intake of sugary foods and drinks Encouraged to increase physical activity to 150 minutes per week - Lipid panel - CMP14 + Anion Gap - Hemoglobin A1c       Minette Brine, FNP

## 2018-02-17 LAB — LIPID PANEL
Chol/HDL Ratio: 2.9 ratio (ref 0.0–5.0)
Cholesterol, Total: 129 mg/dL (ref 100–199)
HDL: 45 mg/dL (ref >39)
LDL Calculated: 60 mg/dL (ref 0–99)
Triglycerides: 120 mg/dL (ref 0–149)
VLDL Cholesterol Cal: 24 mg/dL (ref 5–40)

## 2018-02-17 LAB — CMP14 + ANION GAP
ALT: 20 IU/L (ref 0–44)
AST: 22 IU/L (ref 0–40)
Albumin/Globulin Ratio: 1.6 (ref 1.2–2.2)
Albumin: 4.6 g/dL (ref 3.8–4.9)
Alkaline Phosphatase: 81 IU/L (ref 39–117)
Anion Gap: 18 mmol/L (ref 10.0–18.0)
BUN/Creatinine Ratio: 20 (ref 9–20)
BUN: 17 mg/dL (ref 6–24)
Bilirubin Total: 0.6 mg/dL (ref 0.0–1.2)
CO2: 27 mmol/L (ref 20–29)
Calcium: 10.5 mg/dL — ABNORMAL HIGH (ref 8.7–10.2)
Chloride: 97 mmol/L (ref 96–106)
Creatinine, Ser: 0.85 mg/dL (ref 0.76–1.27)
GFR calc Af Amer: 116 mL/min/{1.73_m2} (ref >59)
GFR calc non Af Amer: 100 mL/min/{1.73_m2} (ref >59)
Globulin, Total: 2.8 g/dL (ref 1.5–4.5)
Glucose: 140 mg/dL — ABNORMAL HIGH (ref 65–99)
Potassium: 4.2 mmol/L (ref 3.5–5.2)
Sodium: 142 mmol/L (ref 134–144)
Total Protein: 7.4 g/dL (ref 6.0–8.5)

## 2018-02-17 LAB — HEMOGLOBIN A1C
Est. average glucose Bld gHb Est-mCnc: 171 mg/dL
Hgb A1c MFr Bld: 7.6 % — ABNORMAL HIGH (ref 4.8–5.6)

## 2018-03-06 ENCOUNTER — Ambulatory Visit: Payer: 59 | Admitting: Internal Medicine

## 2018-03-12 ENCOUNTER — Other Ambulatory Visit: Payer: Self-pay | Admitting: Internal Medicine

## 2018-03-12 MED FILL — ROSUVASTATIN CALCIUM 20 MG: 20 | 90 days supply | Qty: 90 | Fill #0

## 2018-03-12 MED FILL — OLMESARTAN-HCTZ 40-25 MG TA: 40-25 | 90 days supply | Qty: 90 | Fill #0 | Status: TO

## 2018-03-12 MED FILL — OZEMPIC 1 MG/DOSE SOPN: 2 | 28 days supply | Qty: 3 | Fill #1

## 2018-03-27 MED FILL — MELOXICAM 15 MG TABLET: 15 | 30 days supply | Qty: 30 | Fill #1

## 2018-04-06 ENCOUNTER — Other Ambulatory Visit: Payer: Self-pay | Admitting: Internal Medicine

## 2018-04-06 MED FILL — PIOGLIT-METFORMIN 15-850: 15-850 | 90 days supply | Qty: 180 | Fill #0

## 2018-04-06 MED FILL — OZEMPIC 1 MG/DOSE SOPN: 2 | 28 days supply | Qty: 3 | Fill #2

## 2018-05-01 ENCOUNTER — Other Ambulatory Visit: Payer: Self-pay | Admitting: Internal Medicine

## 2018-05-01 MED FILL — OZEMPIC 1 MG/DOSE SOPN: 2 | 28 days supply | Qty: 3 | Fill #0

## 2018-05-01 MED FILL — MELOXICAM 15 MG TABLET: 15 | 30 days supply | Qty: 30 | Fill #0

## 2018-05-22 ENCOUNTER — Ambulatory Visit: Payer: 59 | Admitting: Internal Medicine

## 2018-05-28 ENCOUNTER — Other Ambulatory Visit: Payer: Self-pay | Admitting: Internal Medicine

## 2018-05-28 MED FILL — OLMESARTAN-HCTZ 40-25 MG TA: 40-25 | 90 days supply | Qty: 90 | Fill #0 | Status: TO

## 2018-05-28 MED FILL — ROSUVASTATIN CALCIUM 20 MG: 20 | 90 days supply | Qty: 90 | Fill #0 | Status: TO

## 2018-05-28 MED FILL — AMLODIPINE BESYLATE 10 MG T: 10 | 90 days supply | Qty: 90 | Fill #0

## 2018-05-28 MED FILL — OZEMPIC 1 MG/DOSE SOPN: 2 | 28 days supply | Qty: 3 | Fill #1 | Status: TO

## 2018-06-06 MED FILL — MELOXICAM 15 MG TABLET: 15 | 30 days supply | Qty: 30 | Fill #1

## 2018-06-12 ENCOUNTER — Other Ambulatory Visit: Payer: Self-pay | Admitting: Internal Medicine

## 2018-06-12 ENCOUNTER — Other Ambulatory Visit: Payer: Self-pay | Admitting: Nurse Practitioner

## 2018-06-12 DIAGNOSIS — I10 Essential (primary) hypertension: Secondary | ICD-10-CM

## 2018-06-18 ENCOUNTER — Other Ambulatory Visit: Payer: Self-pay | Admitting: Internal Medicine

## 2018-06-18 MED FILL — ACCU-CHEK GUIDE STRP: 50 days supply | Qty: 100 | Fill #0

## 2018-06-18 MED FILL — ACCU-CHEK FASTCLIX LANCETS: 90 days supply | Qty: 204 | Fill #0

## 2018-06-19 MED FILL — OZEMPIC 1 MG/DOSE SOPN: 2 | 28 days supply | Qty: 3 | Fill #0

## 2018-06-26 MED FILL — BD PEN NDL NANO 32GX5/32": 32G X 4 MM | 90 days supply | Qty: 100 | Fill #1

## 2018-06-26 MED FILL — BD PEN NDL NANO 32GX5/32: 32G X 4 MM | 90 days supply | Qty: 100 | Fill #1

## 2018-07-02 ENCOUNTER — Other Ambulatory Visit: Payer: Self-pay

## 2018-07-02 ENCOUNTER — Encounter: Payer: Self-pay | Admitting: Internal Medicine

## 2018-07-02 ENCOUNTER — Ambulatory Visit: Payer: 59 | Admitting: Internal Medicine

## 2018-07-02 VITALS — BP 122/86 | HR 90 | Temp 98.6°F | Ht 65.4 in | Wt 170.2 lb

## 2018-07-02 DIAGNOSIS — Z23 Encounter for immunization: Secondary | ICD-10-CM | POA: Diagnosis not present

## 2018-07-02 DIAGNOSIS — E1165 Type 2 diabetes mellitus with hyperglycemia: Secondary | ICD-10-CM | POA: Diagnosis not present

## 2018-07-02 DIAGNOSIS — I10 Essential (primary) hypertension: Secondary | ICD-10-CM

## 2018-07-02 DIAGNOSIS — E7800 Pure hypercholesterolemia, unspecified: Secondary | ICD-10-CM | POA: Diagnosis not present

## 2018-07-02 MED ORDER — PREVNAR 13 IM SUSP: 0.5000 mL | INTRAMUSCULAR | 0 refills | Status: AC

## 2018-07-02 MED ORDER — PIOGLITAZONE HCL-METFORMIN HCL 15-850 MG PO TABS: 1.0000 | ORAL_TABLET | Freq: Two times a day (BID) | ORAL | 2 refills | Status: DC

## 2018-07-02 MED FILL — PIOGLIT-METFORMIN 15-850: 15-850 | 90 days supply | Qty: 180 | Fill #0

## 2018-07-02 NOTE — Progress Notes (Signed)
Subjective:     Patient ID: Phillip Armstrong , male    DOB: 05/19/1965 , 53 y.o.   MRN: [id number]   Chief Complaint  Patient presents with  . Diabetes  . Hypertension    HPI  Diabetes He presents for his follow-up diabetic visit. He has type 2 diabetes mellitus. There are no hypoglycemic associated symptoms. Pertinent negatives for diabetes include no blurred vision and no chest pain. There are no hypoglycemic complications. Risk factors for coronary artery disease include diabetes mellitus, dyslipidemia, hypertension and male sex. He participates in exercise intermittently. An ACE inhibitor/angiotensin II receptor blocker is being taken.  Hypertension This is a chronic problem. The current episode started more than 1 year ago. The problem has been gradually improving since onset. The problem is controlled. Pertinent negatives include no blurred vision or chest pain.     Past Medical History:  Diagnosis Date  . Diabetes mellitus 2000   T2DM  . Hyperlipidemia   . Hypertension      Family History  Problem Relation Age of Onset  . Diabetes Son        T1DM  . Hypertension Other   . Hypertension Father      Current Outpatient Medications:  .  Accu-Chek FastClix Lancets MISC, CHECK BLOOD SUGAR BEFORE BREAKFAST AND DINNER, Disp: 306 each, Rfl: 11 .  ACCU-CHEK GUIDE test strip, CHECK BLOOD SUGARS BY INTRADERMAL ROUTE 2 TIMES PER DAY, Disp: 100 each, Rfl: 11 .  amLODipine (NORVASC) 10 MG tablet, TAKE 1 TABLET (10 MG TOTAL) BY MOUTH DAILY., Disp: 90 tablet, Rfl: 0 .  aspirin 81 MG tablet, Take 81 mg by mouth daily. , Disp: , Rfl:  .  Lancets Misc. (ACCU-CHEK FASTCLIX LANCET) KIT, by Does not apply route., Disp: , Rfl:  .  meloxicam (MOBIC) 15 MG tablet, TAKE 1 TABLET BY MOUTH DAILY AS NEEDED FOR KNEE PAIN, Disp: 30 tablet, Rfl: 1 .  Multiple Vitamins-Minerals (MULTIVITAMIN WITH MINERALS) tablet, Take 1 tablet by mouth daily., Disp: , Rfl:  .  nystatin (MYCOSTATIN) powder, Apply 1 g  topically 2 (two) times daily as needed. Reported on 02/02/2015, Disp: , Rfl:  .  olmesartan-hydrochlorothiazide (BENICAR HCT) 40-25 MG tablet, TAKE 1 TABLET BY MOUTH DAILY, Disp: 180 tablet, Rfl: 2 .  OZEMPIC, 1 MG/DOSE, 2 MG/1.5ML SOPN, INJECT 1 MG SUBCUTANEOUSLY EVERY WEEK ON THE SAME DAY OF EACH WEEK, IN THE ABDOMEN, THIGHS, OR UPPER ARM ROTATING INJECTION SITES, Disp: 3 mL, Rfl: 2 .  pioglitazone-metformin (ACTOPLUS MET) 15-850 MG tablet, Take 1 tablet by mouth 2 (two) times daily., Disp: 180 tablet, Rfl: 2 .  rosuvastatin (CRESTOR) 20 MG tablet, TAKE 1 TABLET BY MOUTH DAILY, Disp: 90 tablet, Rfl: 2 .  pneumococcal 13-valent conjugate vaccine (PREVNAR 13) SUSP injection, Inject 0.5 mLs into the muscle tomorrow at 10 am for 1 dose., Disp: 1 Syringe, Rfl: 0   No Known Allergies   Review of Systems  Constitutional: Negative.   Eyes: Negative for blurred vision.  Respiratory: Negative.   Cardiovascular: Negative.  Negative for chest pain.  Gastrointestinal: Negative.   Neurological: Negative.   Psychiatric/Behavioral: Negative.      Today's Vitals   07/02/18 1110  BP: 122/86  Pulse: 90  Temp: 98.6 F (37 C)  TempSrc: Oral  Weight: 170 lb 3.2 oz (77.2 kg)  Height: 5' 5.4" (1.661 m)   Body mass index is 27.98 kg/m.   Objective:  Physical Exam Vitals signs and nursing note reviewed.  Constitutional:        Appearance: Normal appearance.  Cardiovascular:     Rate and Rhythm: Normal rate and regular rhythm.     Pulses:          Dorsalis pedis pulses are 2+ on the right side and 2+ on the left side.     Heart sounds: Normal heart sounds.  Pulmonary:     Effort: Pulmonary effort is normal.     Breath sounds: Normal breath sounds.  Feet:     Right foot:     Protective Sensation: 5 sites tested. 5 sites sensed.     Skin integrity: Skin integrity normal.     Toenail Condition: Right toenails are normal.     Left foot:     Protective Sensation: 5 sites tested. 5 sites sensed.      Skin integrity: Skin integrity normal.     Toenail Condition: Left toenails are normal.  Skin:    General: Skin is warm.  Neurological:     General: No focal deficit present.     Mental Status: He is alert.  Psychiatric:        Mood and Affect: Mood normal.         Assessment And Plan:     1. Uncontrolled type 2 diabetes mellitus with hyperglycemia (HCC)  Diabetic foot exam was performed.  I will check labs as listed below. He is encouraged to make sure he is here every 4 months for evaluation.   - CMP14+EGFR - Hemoglobin A1c  2. Essential hypertension  Well controlled. He will continue with current meds. He is encouraged to avoid adding salt to his foods.   3. Pure hypercholesterolemia  Chronic. I will check fasting lipid panel today. He is encouraged to avoid fried foods, exercise no less than five days weekly and to increase his fiber intake.   - Lipid panel    4. Need for vaccination  - pneumococcal 13-valent conjugate vaccine (PREVNAR 13) SUSP injection; Inject 0.5 mLs into the muscle tomorrow at 10 am for 1 dose.  Dispense: 1 Syringe; Refill: 0   Robyn N Sanders, MD    THE PATIENT IS ENCOURAGED TO PRACTICE SOCIAL DISTANCING DUE TO THE COVID-19 PANDEMIC.   

## 2018-07-02 NOTE — Patient Instructions (Signed)
Mediterranean Diet  A Mediterranean diet refers to food and lifestyle choices that are based on the traditions of countries located on the [place]. This way of eating has been shown to help prevent certain conditions and improve outcomes for people who have chronic diseases, like kidney disease and heart disease.  What are tips for following this plan?  Lifestyle   Cook and eat meals together with your family, when possible.   Drink enough fluid to keep your urine clear or pale yellow.   Be physically active every day. This includes:  ? Aerobic exercise like running or swimming.  ? Leisure activities like gardening, walking, or housework.   Get 7-8 hours of sleep each night.   If recommended by your health care provider, drink red wine in moderation. This means 1 glass a day for nonpregnant women and 2 glasses a day for men. A glass of wine equals 5 oz (150 mL).  Reading food labels     Check the serving size of packaged foods. For foods such as rice and pasta, the serving size refers to the amount of cooked product, not dry.   Check the total fat in packaged foods. Avoid foods that have saturated fat or trans fats.   Check the ingredients list for added sugars, such as corn syrup.  Shopping   At the grocery store, buy most of your food from the areas near the walls of the store. This includes:  ? Fresh fruits and vegetables (produce).  ? Grains, beans, nuts, and seeds. Some of these may be available in unpackaged forms or large amounts (in bulk).  ? Fresh seafood.  ? Poultry and eggs.  ? Low-fat dairy products.   Buy whole ingredients instead of prepackaged foods.   Buy fresh fruits and vegetables in-season from local farmers markets.   Buy frozen fruits and vegetables in resealable bags.   If you do not have access to quality fresh seafood, buy precooked frozen shrimp or canned fish, such as tuna, salmon, or sardines.   Buy small amounts of raw or cooked vegetables, salads, or olives from  the deli or salad bar at your store.   Stock your pantry so you always have certain foods on hand, such as olive oil, canned tuna, canned tomatoes, rice, pasta, and beans.  Cooking   Cook foods with extra-virgin olive oil instead of using butter or other vegetable oils.   Have meat as a side dish, and have vegetables or grains as your main dish. This means having meat in small portions or adding small amounts of meat to foods like pasta or stew.   Use beans or vegetables instead of meat in common dishes like chili or lasagna.   Experiment with different cooking methods. Try roasting or broiling vegetables instead of steaming or sauteing them.   Add frozen vegetables to soups, stews, pasta, or rice.   Add nuts or seeds for added healthy fat at each meal. You can add these to yogurt, salads, or vegetable dishes.   Marinate fish or vegetables using olive oil, lemon juice, garlic, and fresh herbs.  Meal planning     Plan to eat 1 vegetarian meal one day each week. Try to work up to 2 vegetarian meals, if possible.   Eat seafood 2 or more times a week.   Have healthy snacks readily available, such as:  ? Vegetable sticks with hummus.  ? Greek yogurt.  ? Fruit and nut trail mix.   Eat balanced   meals throughout the week. This includes:  ? Fruit: 2-3 servings a day  ? Vegetables: 4-5 servings a day  ? Low-fat dairy: 2 servings a day  ? Fish, poultry, or lean meat: 1 serving a day  ? Beans and legumes: 2 or more servings a week  ? Nuts and seeds: 1-2 servings a day  ? Whole grains: 6-8 servings a day  ? Extra-virgin olive oil: 3-4 servings a day   Limit red meat and sweets to only a few servings a month  What are my food choices?   Mediterranean diet  ? Recommended  ? Grains: Whole-grain pasta. Brown rice. Bulgar wheat. Polenta. Couscous. Whole-wheat bread. Oatmeal. Quinoa.  ? Vegetables: Artichokes. Beets. Broccoli. Cabbage. Carrots. Eggplant. Green beans. Chard. Kale. Spinach. Onions. Leeks. Peas. Squash.  Tomatoes. Peppers. Radishes.  ? Fruits: Apples. Apricots. Avocado. Berries. Bananas. Cherries. Dates. Figs. Grapes. Lemons. Melon. Oranges. Peaches. Plums. Pomegranate.  ? Meats and other protein foods: Beans. Almonds. Sunflower seeds. Pine nuts. Peanuts. Cod. Salmon. Scallops. Shrimp. Tuna. Tilapia. Clams. Oysters. Eggs.  ? Dairy: Low-fat milk. Cheese. Greek yogurt.  ? Beverages: Water. Red wine. Herbal tea.  ? Fats and oils: Extra virgin olive oil. Avocado oil. Grape seed oil.  ? Sweets and desserts: Greek yogurt with honey. Baked apples. Poached pears. Trail mix.  ? Seasoning and other foods: Basil. Cilantro. Coriander. Cumin. Mint. Parsley. Sage. Rosemary. Tarragon. Garlic. Oregano. Thyme. Pepper. Balsalmic vinegar. Tahini. Hummus. Tomato sauce. Olives. Mushrooms.  ? Limit these  ? Grains: Prepackaged pasta or rice dishes. Prepackaged cereal with added sugar.  ? Vegetables: Deep fried potatoes (french fries).  ? Fruits: Fruit canned in syrup.  ? Meats and other protein foods: Beef. Pork. Lamb. Poultry with skin. Hot dogs. Bacon.  ? Dairy: Ice cream. Sour cream. Whole milk.  ? Beverages: Juice. Sugar-sweetened soft drinks. Beer. Liquor and spirits.  ? Fats and oils: Butter. Canola oil. Vegetable oil. Beef fat (tallow). Lard.  ? Sweets and desserts: Cookies. Cakes. Pies. Candy.  ? Seasoning and other foods: Mayonnaise. Premade sauces and marinades.  ? The items listed may not be a complete list. Talk with your dietitian about what dietary choices are right for you.  Summary   The Mediterranean diet includes both food and lifestyle choices.   Eat a variety of fresh fruits and vegetables, beans, nuts, seeds, and whole grains.   Limit the amount of red meat and sweets that you eat.   Talk with your health care provider about whether it is safe for you to drink red wine in moderation. This means 1 glass a day for nonpregnant women and 2 glasses a day for men. A glass of wine equals 5 oz (150 mL).  This information  is not intended to replace advice given to you by your health care provider. Make sure you discuss any questions you have with your health care provider.  Document Released: 08/27/2015 Document Revised: 09/29/2015 Document Reviewed: 08/27/2015  [employer]vier Interactive Patient Education  2019 [employer]

## 2018-07-03 LAB — CMP14+EGFR
ALT: 16 IU/L (ref 0–44)
AST: 21 IU/L (ref 0–40)
Albumin/Globulin Ratio: 1.9 (ref 1.2–2.2)
Albumin: 4.6 g/dL (ref 3.8–4.9)
Alkaline Phosphatase: 69 IU/L (ref 39–117)
BUN/Creatinine Ratio: 17 (ref 9–20)
BUN: 15 mg/dL (ref 6–24)
Bilirubin Total: 0.6 mg/dL (ref 0.0–1.2)
CO2: 26 mmol/L (ref 20–29)
Calcium: 10 mg/dL (ref 8.7–10.2)
Chloride: 98 mmol/L (ref 96–106)
Creatinine, Ser: 0.9 mg/dL (ref 0.76–1.27)
GFR calc Af Amer: 113 mL/min/{1.73_m2} (ref >59)
GFR calc non Af Amer: 98 mL/min/{1.73_m2} (ref >59)
Globulin, Total: 2.4 g/dL (ref 1.5–4.5)
Glucose: 125 mg/dL — ABNORMAL HIGH (ref 65–99)
Potassium: 4.1 mmol/L (ref 3.5–5.2)
Sodium: 139 mmol/L (ref 134–144)
Total Protein: 7 g/dL (ref 6.0–8.5)

## 2018-07-03 LAB — LIPID PANEL
Chol/HDL Ratio: 3 ratio (ref 0.0–5.0)
Cholesterol, Total: 128 mg/dL (ref 100–199)
HDL: 42 mg/dL (ref >39)
LDL Calculated: 75 mg/dL (ref 0–99)
Triglycerides: 56 mg/dL (ref 0–149)
VLDL Cholesterol Cal: 11 mg/dL (ref 5–40)

## 2018-07-03 LAB — HEMOGLOBIN A1C
Est. average glucose Bld gHb Est-mCnc: 169 mg/dL
Hgb A1c MFr Bld: 7.5 % — ABNORMAL HIGH (ref 4.8–5.6)

## 2018-07-05 ENCOUNTER — Encounter: Payer: Self-pay | Admitting: Internal Medicine

## 2018-07-13 ENCOUNTER — Other Ambulatory Visit: Payer: Self-pay | Admitting: Internal Medicine

## 2018-07-13 MED FILL — MELOXICAM 15 MG TABLET: 15 | 30 days supply | Qty: 30 | Fill #0

## 2018-07-13 MED FILL — NYSTATIN 100000 UNIT/GM POW: 100000 | 15 days supply | Qty: 15 | Fill #0

## 2018-08-07 ENCOUNTER — Other Ambulatory Visit: Payer: Self-pay | Admitting: Internal Medicine

## 2018-08-07 MED FILL — OZEMPIC 1 MG/DOSE SOPN: 2 | 28 days supply | Qty: 3 | Fill #0

## 2018-08-13 MED FILL — MELOXICAM 15 MG TABLET: 15 | 30 days supply | Qty: 30 | Fill #1

## 2018-08-15 MED FILL — AMLODIPINE BESYLATE 10 MG T: 10 | 90 days supply | Qty: 90 | Fill #0

## 2018-09-03 ENCOUNTER — Other Ambulatory Visit: Payer: Self-pay

## 2018-09-03 MED ORDER — OZEMPIC (1 MG/DOSE) 2 MG/1.5ML ~~LOC~~ SOPN: 1.0000 mg | PEN_INJECTOR | SUBCUTANEOUS | 1 refills | Status: DC

## 2018-09-03 MED FILL — OLMESARTAN-HCTZ 40-25 MG TA: 40-25 | 90 days supply | Qty: 90 | Fill #0

## 2018-09-03 MED FILL — ROSUVASTATIN CALCIUM 20 MG: 20 | 90 days supply | Qty: 90 | Fill #0

## 2018-09-04 MED FILL — OZEMPIC 1 MG/DOSE SOPN: 2 | 30 days supply | Qty: 3 | Fill #0

## 2018-09-06 MED FILL — BD PEN NDL NANO 32GX5/32": 32G X 4 MM | 90 days supply | Qty: 100 | Fill #2

## 2018-09-06 MED FILL — BD PEN NDL NANO 32GX5/32: 32G X 4 MM | 90 days supply | Qty: 100 | Fill #2

## 2018-09-21 ENCOUNTER — Other Ambulatory Visit: Payer: Self-pay | Admitting: Internal Medicine

## 2018-09-25 MED FILL — MELOXICAM 15 MG TABLET: 15 | 30 days supply | Qty: 30 | Fill #0

## 2018-09-25 NOTE — Telephone Encounter (Signed)
Would like a refill on Meloxicam (mobic) 15 mg tab

## 2018-09-27 MED FILL — ACCU-CHEK GUIDE STRP: 50 days supply | Qty: 100 | Fill #1

## 2018-10-03 MED FILL — PIOGLIT-METFORMIN 15-850: 15-850 | 90 days supply | Qty: 180 | Fill #1

## 2018-10-30 ENCOUNTER — Encounter: Payer: Self-pay | Admitting: Internal Medicine

## 2018-10-30 DIAGNOSIS — H524 Presbyopia: Secondary | ICD-10-CM | POA: Diagnosis not present

## 2018-10-30 LAB — HM DIABETES EYE EXAM

## 2018-11-02 MED FILL — MELOXICAM 15 MG TABLET: 15 | 30 days supply | Qty: 30 | Fill #1

## 2018-11-06 ENCOUNTER — Other Ambulatory Visit: Payer: Self-pay

## 2018-11-06 ENCOUNTER — Ambulatory Visit: Payer: 59 | Admitting: Internal Medicine

## 2018-11-06 ENCOUNTER — Encounter: Payer: Self-pay | Admitting: Internal Medicine

## 2018-11-06 VITALS — BP 144/88 | HR 81 | Temp 98.6°F | Ht 64.6 in | Wt 166.8 lb

## 2018-11-06 DIAGNOSIS — I10 Essential (primary) hypertension: Secondary | ICD-10-CM | POA: Diagnosis not present

## 2018-11-06 DIAGNOSIS — Z1211 Encounter for screening for malignant neoplasm of colon: Secondary | ICD-10-CM | POA: Diagnosis not present

## 2018-11-06 DIAGNOSIS — R0982 Postnasal drip: Secondary | ICD-10-CM | POA: Diagnosis not present

## 2018-11-06 DIAGNOSIS — Z0000 Encounter for general adult medical examination without abnormal findings: Secondary | ICD-10-CM

## 2018-11-06 DIAGNOSIS — Z23 Encounter for immunization: Secondary | ICD-10-CM | POA: Diagnosis not present

## 2018-11-06 DIAGNOSIS — E1165 Type 2 diabetes mellitus with hyperglycemia: Secondary | ICD-10-CM

## 2018-11-06 LAB — POCT UA - MICROALBUMIN
Albumin/Creatinine Ratio, Urine, POC: <30
Creatinine, POC: 50 mg/dL
Microalbumin Ur, POC: 10 mg/L

## 2018-11-06 LAB — POCT URINALYSIS DIPSTICK
Bilirubin, UA: NEGATIVE
Blood, UA: NEGATIVE
Glucose, UA: NEGATIVE
Ketones, UA: NEGATIVE
Leukocytes, UA: NEGATIVE
Nitrite, UA: NEGATIVE
Protein, UA: NEGATIVE
Spec Grav, UA: 1.015 (ref 1.010–1.025)
Urobilinogen, UA: 0.2 E.U./dL
pH, UA: 7.5 (ref 5.0–8.0)

## 2018-11-06 LAB — POC HEMOCCULT BLD/STL (OFFICE/1-CARD/DIAGNOSTIC)
Card #1 Date: 10202020
Fecal Occult Blood, POC: NEGATIVE

## 2018-11-06 NOTE — Progress Notes (Signed)
Subjective:     Patient ID: Phillip Armstrong , male    DOB: 1965-01-30 , 53 y.o.   MRN: [id number]   Chief Complaint  Patient presents with  . Annual Exam  . Diabetes  . Hypertension    HPI  He is here today for a full physical examination. He has no specific concerns or complaints at this time.   Diabetes He presents for his follow-up diabetic visit. He has type 2 diabetes mellitus. There are no hypoglycemic associated symptoms. There are no diabetic associated symptoms. Pertinent negatives for diabetes include no blurred vision and no chest pain. There are no diabetic complications. An ACE inhibitor/angiotensin II receptor blocker is being taken. Eye exam is current.  Hypertension This is a chronic problem. The current episode started yesterday. The problem has been gradually improving since onset. The problem is controlled. Pertinent negatives include no blurred vision, chest pain, palpitations or shortness of breath. Risk factors for coronary artery disease include dyslipidemia and diabetes mellitus. Past treatments include angiotensin blockers and diuretics. The current treatment provides moderate improvement.     Past Medical History:  Diagnosis Date  . Diabetes mellitus 2000   T2DM  . Hyperlipidemia   . Hypertension      Family History  Problem Relation Age of Onset  . Diabetes Son        T1DM  . Hypertension Other   . Hypertension Father      Current Outpatient Medications:  .  Accu-Chek FastClix Lancets MISC, CHECK BLOOD SUGAR BEFORE BREAKFAST AND DINNER, Disp: 306 each, Rfl: 11 .  ACCU-CHEK GUIDE test strip, CHECK BLOOD SUGARS BY INTRADERMAL ROUTE 2 TIMES PER DAY, Disp: 100 each, Rfl: 11 .  amLODipine (NORVASC) 10 MG tablet, TAKE 1 TABLET (10 MG TOTAL) BY MOUTH DAILY., Disp: 90 tablet, Rfl: 0 .  aspirin 81 MG tablet, Take 81 mg by mouth daily. , Disp: , Rfl:  .  Lancets Misc. (ACCU-CHEK FASTCLIX LANCET) KIT, by Does not apply route., Disp: , Rfl:  .  meloxicam  (MOBIC) 15 MG tablet, TAKE 1 TABLET BY MOUTH DAILY AS NEEDED FOR KNEE PAIN, Disp: 30 tablet, Rfl: 1 .  Multiple Vitamins-Minerals (MULTIVITAMIN WITH MINERALS) tablet, Take 1 tablet by mouth daily., Disp: , Rfl:  .  NYSTATIN powder, APPLY TO THE AFFECTED AREA(S) TWICE DAILY AS DIRECTED, Disp: 60 g, Rfl: 2 .  olmesartan-hydrochlorothiazide (BENICAR HCT) 40-25 MG tablet, TAKE 1 TABLET BY MOUTH DAILY, Disp: 180 tablet, Rfl: 2 .  pioglitazone-metformin (ACTOPLUS MET) 15-850 MG tablet, Take 1 tablet by mouth 2 (two) times daily., Disp: 180 tablet, Rfl: 2 .  rosuvastatin (CRESTOR) 20 MG tablet, TAKE 1 TABLET BY MOUTH DAILY, Disp: 90 tablet, Rfl: 2 .  Semaglutide, 1 MG/DOSE, (OZEMPIC, 1 MG/DOSE,) 2 MG/1.5ML SOPN, Inject 1 mg into the skin once a week., Disp: 3 pen, Rfl: 1   No Known Allergies   Men's preventive visit. Patient Health Questionnaire (PHQ-2) is    Office Visit from 11/06/2018 in [facility]  PHQ-2 Total Score  0    . Patient is on a healthy diet. Marital status: Single. Relevant history for alcohol use is:  Social History   Substance and Sexual Activity  Alcohol Use No  . Relevant history for tobacco use is:  Social History   Tobacco Use  Smoking Status Never Smoker  Smokeless Tobacco Never Used  .  Review of Systems  Constitutional: Negative.   HENT: Positive for postnasal drip.   Eyes: Negative.  Negative for blurred vision.  Respiratory: Negative.  Negative for shortness of breath.   Cardiovascular: Negative.  Negative for chest pain and palpitations.  Endocrine: Negative.   Genitourinary: Negative.   Musculoskeletal: Negative.   Skin: Negative.   Allergic/Immunologic: Negative.   Neurological: Negative.   Hematological: Negative.   Psychiatric/Behavioral: Negative.      Today's Vitals   11/06/18 0956  BP: (!) 144/88  Pulse: 81  Temp: 98.6 F (37 C)  TempSrc: Oral  SpO2: 97%  Weight: 166 lb 12.8 oz (75.7 kg)  Height: 5' 4.6" (1.641 m)    Body mass index is 28.1 kg/m.   Objective:  Physical Exam Vitals signs and nursing note reviewed.  Constitutional:      Appearance: Normal appearance.  HENT:     Head: Normocephalic and atraumatic.     Right Ear: Tympanic membrane, ear canal and external ear normal.     Left Ear: Tympanic membrane, ear canal and external ear normal.     Nose: Nose normal.     Mouth/Throat:     Mouth: Mucous membranes are moist.     Pharynx: Oropharynx is clear.  Eyes:     Extraocular Movements: Extraocular movements intact.     Conjunctiva/sclera: Conjunctivae normal.     Pupils: Pupils are equal, round, and reactive to light.  Neck:     Musculoskeletal: Normal range of motion and neck supple.  Cardiovascular:     Rate and Rhythm: Normal rate and regular rhythm.     Pulses: Normal pulses.          Dorsalis pedis pulses are 2+ on the right side and 2+ on the left side.       Posterior tibial pulses are 2+ on the right side and 2+ on the left side.     Heart sounds: Normal heart sounds.  Pulmonary:     Effort: Pulmonary effort is normal.     Breath sounds: Normal breath sounds.  Chest:     Breasts:        Right: Normal. No swelling, bleeding, inverted nipple, mass or nipple discharge.        Left: Normal. No swelling, bleeding, inverted nipple, mass or nipple discharge.  Abdominal:     General: Abdomen is flat. Bowel sounds are normal.     Palpations: Abdomen is soft.  Genitourinary:    Prostate: Normal.     Rectum: Normal. Guaiac result negative.  Musculoskeletal: Normal range of motion.  Feet:     Right foot:     Protective Sensation: 5 sites tested. 5 sites sensed.     Skin integrity: Skin integrity normal.     Toenail Condition: Right toenails are normal.     Left foot:     Protective Sensation: 5 sites tested. 5 sites sensed.     Skin integrity: Skin integrity normal.     Toenail Condition: Left toenails are normal.  Skin:    General: Skin is warm.  Neurological:      General: No focal deficit present.     Mental Status: He is alert.  Psychiatric:        Mood and Affect: Mood normal.        Behavior: Behavior normal.         Assessment And Plan:     1. Annual physical exam  A full exam was performed.  DRE performed, stool is heme negative.  PATIENT HAS BEEN ADVISED TO GET 30-45 MINUTES REGULAR EXERCISE NO LESS THAN  FOUR TO FIVE DAYS PER WEEK - BOTH WEIGHTBEARING EXERCISES AND AEROBIC ARE RECOMMENDED.  HE WAS ADVISED TO FOLLOW A HEALTHY DIET WITH AT LEAST SIX FRUITS/VEGGIES PER DAY, DECREASE INTAKE OF RED MEAT, AND TO INCREASE FISH INTAKE TO TWO DAYS PER WEEK.  MEATS/FISH SHOULD NOT BE FRIED, BAKED OR BROILED IS PREFERABLE.  I SUGGEST WEARING SPF 50 SUNSCREEN ON EXPOSED PARTS AND ESPECIALLY WHEN IN THE DIRECT SUNLIGHT FOR AN EXTENDED PERIOD OF TIME.  PLEASE AVOID FAST FOOD RESTAURANTS AND INCREASE YOUR WATER INTAKE.  - CMP14+EGFR - CBC - Hemoglobin A1c - PSA - TSH  2. Uncontrolled type 2 diabetes mellitus with hyperglycemia (HCC)  Diabetic foot exam was performed. I plan to wean him off of pioglitazone in the near future and replace this with [place]. This will help to improve his BP readings as well. I will make further recommendations once his labs are available for review. I DISCUSSED WITH THE PATIENT AT LENGTH REGARDING THE GOALS OF GLYCEMIC CONTROL AND POSSIBLE LONG-TERM COMPLICATIONS.  I  ALSO STRESSED THE IMPORTANCE OF COMPLIANCE WITH HOME GLUCOSE MONITORING, DIETARY RESTRICTIONS INCLUDING AVOIDANCE OF SUGARY DRINKS/PROCESSED FOODS,  ALONG WITH REGULAR EXERCISE.  I  ALSO STRESSED THE IMPORTANCE OF ANNUAL EYE EXAMS, SELF FOOT CARE AND COMPLIANCE WITH OFFICE VISITS.  - POCT Urinalysis Dipstick (81002) - POCT UA - Microalbumin  3. Essential hypertension  Chronic, fair control. He reports his BP are well controlled at home - 120s/80s. He will continue with current meds for now. He is encouraged to incorporate more exercise into his daily routine.  EKG performed, no acute changes noted. He is encouraged to avoid adding salt to his foods. He will rto in four months for re-evaluation.   - EKG 12-Lead  4. Postnasal drip  He was given samples of Zyrtec, '10mg'$  to take nightly as needed. He will let me know if his sx persist.   [name], MD    THE PATIENT IS ENCOURAGED TO PRACTICE SOCIAL DISTANCING DUE TO THE COVID-19 PANDEMIC.

## 2018-11-06 NOTE — Patient Instructions (Addendum)
Dentist: Dr. Lavella Lemons Redd Dr. Iantha Fallen Dr. Sallee Lange   Phillip Armstrong, Male Adopting a healthy lifestyle and getting preventive care are important in promoting Phillip and wellness. Ask your Phillip care provider about:  The right schedule for you to have regular tests and exams.  Things you can do on your own to prevent diseases and keep yourself healthy. What should I know about diet, weight, and exercise? Eat a healthy diet   Eat a diet that includes plenty of vegetables, fruits, low-fat dairy products, and lean protein.  Do not eat a lot of foods that are high in solid fats, added sugars, or sodium. Maintain a healthy weight Body mass index (BMI) is a measurement that can be used to identify possible weight problems. It estimates body fat based on height and weight. Your Phillip care provider can help determine your BMI and help you achieve or maintain a healthy weight. Get regular exercise Get regular exercise. This is one of the most important things you can do for your Phillip. Most adults should:  Exercise for at least 150 minutes each week. The exercise should increase your heart rate and make you sweat (moderate-intensity exercise).  Do strengthening exercises at least twice a week. This is in addition to the moderate-intensity exercise.  Spend less time sitting. Even light physical activity can be beneficial. Watch cholesterol and blood lipids Have your blood tested for lipids and cholesterol at 53 years of age, then have this test every 5 years. You may need to have your cholesterol levels checked more often if:  Your lipid or cholesterol levels are high.  You are older than 53 years of age.  You are at high risk for heart disease. What should I know about cancer screening? Many types of cancers can be detected early and may often be prevented. Depending on your Phillip history and family history, you may need to have cancer screening at various ages.  This may include screening for:  Colorectal cancer.  Prostate cancer.  Skin cancer.  Lung cancer. What should I know about heart disease, diabetes, and high blood pressure? Blood pressure and heart disease  High blood pressure causes heart disease and increases the risk of stroke. This is more likely to develop in people who have high blood pressure readings, are of African descent, or are overweight.  Talk with your Phillip care provider about your target blood pressure readings.  Have your blood pressure checked: ? Every 3-5 years if you are 68-37 years of age. ? Every year if you are 32 years old or older.  If you are between the ages of 40 and 75 and are a current or former smoker, ask your Phillip care provider if you should have a one-time screening for abdominal aortic aneurysm (AAA). Diabetes Have regular diabetes screenings. This checks your fasting blood sugar level. Have the screening done:  Once every three years after age 54 if you are at a normal weight and have a low risk for diabetes.  More often and at a younger age if you are overweight or have a high risk for diabetes. What should I know about preventing infection? Hepatitis B If you have a higher risk for hepatitis B, you should be screened for this virus. Talk with your Phillip care provider to find out if you are at risk for hepatitis B infection. Hepatitis C Blood testing is recommended for:  Everyone born from [birth year] through 1965.  Anyone with known risk factors for hepatitis  C. Sexually transmitted infections (STIs)  You should be screened each year for STIs, including gonorrhea and chlamydia, if: ? You are sexually active and are younger than 53 years of age. ? You are older than 53 years of age and your Phillip care provider tells you that you are at risk for this type of infection. ? Your sexual activity has changed since you were last screened, and you are at increased risk for chlamydia or gonorrhea.  Ask your Phillip care provider if you are at risk.  Ask your Phillip care provider about whether you are at high risk for HIV. Your Phillip care provider may recommend a prescription medicine to help prevent HIV infection. If you choose to take medicine to prevent HIV, you should first get tested for HIV. You should then be tested every 3 months for as long as you are taking the medicine. Follow these instructions at home: Lifestyle  Do not use any products that contain nicotine or tobacco, such as cigarettes, e-cigarettes, and chewing tobacco. If you need help quitting, ask your Phillip care provider.  Do not use street drugs.  Do not share needles.  Ask your Phillip care provider for help if you need support or information about quitting drugs. Alcohol use  Do not drink alcohol if your Phillip care provider tells you not to drink.  If you drink alcohol: ? Limit how much you have to 0-2 drinks a day. ? Be aware of how much alcohol is in your drink. In the [place] one drink equals one 12 oz bottle of beer (355 mL), one 5 oz glass of wine (148 mL), or one 1 oz glass of hard liquor (44 mL). General instructions  Schedule regular Phillip, dental, and eye exams.  Stay current with your vaccines.  Tell your Phillip care provider if: ? You often feel depressed. ? You have ever been abused or do not feel safe at home. Summary  Adopting a healthy lifestyle and getting preventive care are important in promoting Phillip and wellness.  Follow your Phillip care provider's instructions about healthy diet, exercising, and getting tested or screened for diseases.  Follow your Phillip care provider's instructions on monitoring your cholesterol and blood pressure. This information is not intended to replace advice given to you by your Phillip care provider. Make sure you discuss any questions you have with your Phillip care provider. Document Released: 07/02/2007 Document Revised: 12/27/2017 Document Reviewed:  12/27/2017 Elsevier Patient Education  2020 [employer].

## 2018-11-07 ENCOUNTER — Telehealth: Payer: Self-pay

## 2018-11-07 LAB — CMP14+EGFR
ALT: 16 IU/L (ref 0–44)
AST: 22 IU/L (ref 0–40)
Albumin/Globulin Ratio: 2 (ref 1.2–2.2)
Albumin: 4.9 g/dL (ref 3.8–4.9)
Alkaline Phosphatase: 85 IU/L (ref 39–117)
BUN/Creatinine Ratio: 16 (ref 9–20)
BUN: 13 mg/dL (ref 6–24)
Bilirubin Total: 0.7 mg/dL (ref 0.0–1.2)
CO2: 27 mmol/L (ref 20–29)
Calcium: 10.2 mg/dL (ref 8.7–10.2)
Chloride: 99 mmol/L (ref 96–106)
Creatinine, Ser: 0.81 mg/dL (ref 0.76–1.27)
GFR calc Af Amer: 118 mL/min/{1.73_m2} (ref >59)
GFR calc non Af Amer: 102 mL/min/{1.73_m2} (ref >59)
Globulin, Total: 2.5 g/dL (ref 1.5–4.5)
Glucose: 121 mg/dL — ABNORMAL HIGH (ref 65–99)
Potassium: 3.8 mmol/L (ref 3.5–5.2)
Sodium: 139 mmol/L (ref 134–144)
Total Protein: 7.4 g/dL (ref 6.0–8.5)

## 2018-11-07 LAB — CBC
Hematocrit: 43.1 % (ref 37.5–51.0)
Hemoglobin: 14.3 g/dL (ref 13.0–17.7)
MCH: 26.6 pg (ref 26.6–33.0)
MCHC: 33.2 g/dL (ref 31.5–35.7)
MCV: 80 fL (ref 79–97)
Platelets: 296 10*3/uL (ref 150–450)
RBC: 5.37 x10E6/uL (ref 4.14–5.80)
RDW: 13.2 % (ref 11.6–15.4)
WBC: 4.2 10*3/uL (ref 3.4–10.8)

## 2018-11-07 LAB — PSA: Prostate Specific Ag, Serum: 0.3 ng/mL (ref 0.0–4.0)

## 2018-11-07 LAB — TSH: TSH: 1.8 u[IU]/mL (ref 0.450–4.500)

## 2018-11-07 LAB — HEMOGLOBIN A1C
Est. average glucose Bld gHb Est-mCnc: 157 mg/dL
Hgb A1c MFr Bld: 7.1 % — ABNORMAL HIGH (ref 4.8–5.6)

## 2018-11-07 NOTE — Telephone Encounter (Signed)
Called to check on pt after his visit yesterday. Pt stated that he is doing fine and he wasn't have any problems and no new symptoms

## 2018-11-12 ENCOUNTER — Encounter: Payer: Self-pay | Admitting: Internal Medicine

## 2018-11-13 ENCOUNTER — Telehealth: Payer: Self-pay

## 2018-11-13 NOTE — Telephone Encounter (Signed)
The pt called and said that he had a concern with his labs because when he looked at his labs online it said stool was normal and the pt was told that stool was collected to use on the stool card during his exam when the provider did a rectal exam on the pt.

## 2018-11-19 ENCOUNTER — Other Ambulatory Visit: Payer: Self-pay | Admitting: Internal Medicine

## 2018-11-19 DIAGNOSIS — I10 Essential (primary) hypertension: Secondary | ICD-10-CM

## 2018-11-19 MED FILL — AMLODIPINE BESYLATE 10 MG T: 10 | 90 days supply | Qty: 90 | Fill #0

## 2018-11-27 ENCOUNTER — Other Ambulatory Visit: Payer: Self-pay | Admitting: Internal Medicine

## 2018-11-27 MED FILL — OZEMPIC 1 MG/DOSE SOPN: 2 | 84 days supply | Qty: 9 | Fill #0

## 2018-11-27 MED FILL — ROSUVASTATIN CALCIUM 20 MG: 20 | 90 days supply | Qty: 90 | Fill #1

## 2018-12-03 MED FILL — OLMESARTAN-HCTZ 40-25 MG TA: 40-25 | 90 days supply | Qty: 90 | Fill #1

## 2018-12-03 MED FILL — ACCU-CHEK GUIDE STRP: 50 days supply | Qty: 100 | Fill #2

## 2018-12-07 ENCOUNTER — Other Ambulatory Visit: Payer: Self-pay | Admitting: Internal Medicine

## 2018-12-07 MED FILL — MELOXICAM 15 MG TABLET: 15 | 30 days supply | Qty: 30 | Fill #0

## 2018-12-31 MED FILL — PIOGLIT-METFORMIN 15-850: 15-850 | 90 days supply | Qty: 180 | Fill #2

## 2019-01-16 ENCOUNTER — Other Ambulatory Visit: Payer: Self-pay | Admitting: Internal Medicine

## 2019-01-16 MED FILL — BD PEN NDL NANO 32GX5/32": 32G X 4 MM | 90 days supply | Qty: 100 | Fill #0

## 2019-01-16 MED FILL — BD PEN NDL NANO 32GX5/32: 32G X 4 MM | 90 days supply | Qty: 100 | Fill #0

## 2019-01-16 MED FILL — MELOXICAM 15 MG TABLET: 15 | 30 days supply | Qty: 30 | Fill #1

## 2019-01-29 ENCOUNTER — Other Ambulatory Visit: Payer: Self-pay

## 2019-01-29 ENCOUNTER — Encounter: Payer: Self-pay | Admitting: Internal Medicine

## 2019-01-29 ENCOUNTER — Ambulatory Visit: Payer: 59 | Admitting: Internal Medicine

## 2019-01-29 VITALS — BP 134/86 | HR 96 | Temp 98.4°F | Ht 64.6 in | Wt 170.4 lb

## 2019-01-29 DIAGNOSIS — Z6828 Body mass index (BMI) 28.0-28.9, adult: Secondary | ICD-10-CM | POA: Diagnosis not present

## 2019-01-29 DIAGNOSIS — E663 Overweight: Secondary | ICD-10-CM | POA: Diagnosis not present

## 2019-01-29 DIAGNOSIS — I10 Essential (primary) hypertension: Secondary | ICD-10-CM | POA: Diagnosis not present

## 2019-01-29 DIAGNOSIS — E1165 Type 2 diabetes mellitus with hyperglycemia: Secondary | ICD-10-CM | POA: Diagnosis not present

## 2019-01-29 MED ORDER — OLMESARTAN MEDOXOMIL-HCTZ 40-25 MG PO TABS: 1.0000 | ORAL_TABLET | Freq: Every day | ORAL | 2 refills | Status: DC

## 2019-01-29 MED ORDER — FREESTYLE LANCETS MISC: 2 refills | Status: DC

## 2019-01-29 MED ORDER — FREESTYLE LITE TEST VI STRP: ORAL_STRIP | 2 refills | Status: DC

## 2019-01-29 MED ORDER — FREESTYLE LITE DEVI: 1 refills | Status: DC

## 2019-01-29 MED FILL — FREESTYLE LANCETS: 50 days supply | Qty: 100 | Fill #0

## 2019-01-29 MED FILL — FREESTYLE LITE TEST STRIP: 50 days supply | Qty: 100 | Fill #0

## 2019-01-29 MED FILL — FREESTYLE LITE METER: 30 days supply | Qty: 1 | Fill #0

## 2019-01-29 NOTE — Patient Instructions (Addendum)
Please call me to let me know when you are about to run out of Actoplusmet.   Dapagliflozin; Metformin extended-release tablets What is this medicine? DAPAGLIFLOZIN; METFORMIN (DAP a gli FLOE zin; met FOR min) is a combination of 2 medicines used to treat type 2 diabetes. This medicine lowers blood sugar. Treatment is combined with a balanced diet and exercise. This drug may also be used to reduce the risk of going to the hospital for heart failure if you have type 2 diabetes and risk factors for heart disease. This medicine may be used for other purposes; ask your health care provider or pharmacist if you have questions. COMMON BRAND NAME(S): Xigduo XR What should I tell my health care provider before I take this medicine? They need to know if you have any of these conditions:  anemia  dehydration  diabetic ketoacidosis  diet low in salt  eating less due to illness, surgery, dieting, or any other reason  having surgery  heart disease  history of pancreatitis or pancreas problems  history of yeast infection of the penis or vagina  if you often drink alcohol  infections in the bladder, kidneys, or urinary tract  kidney disease  liver disease  low blood pressure  polycystic ovary syndrome  problems urinating  serious infection or injury  type 1 diabetes  uncircumcised male  vomiting  an unusual or allergic reaction to dapagliflozin, metformin, other medicines, foods, dyes, or preservatives  pregnant or trying to get pregnant  breast-feeding How should I use this medicine? Take this medicine by mouth with a glass of water. Take this medicine in the morning with food. Follow the directions on the prescription label. Do not cut, crush, or chew this medicine. Take your doses at regular intervals. Do not take your medicine more often than directed. Do not stop taking except on your doctor's advice. A special MedGuide will be given to you by the pharmacist with each  prescription and refill. Be sure to read this information carefully each time. Talk to your pediatrician regarding the use of this medicine in children. Special care may be needed. Overdosage: If you think you have taken too much of this medicine contact a poison control center or emergency room at once. NOTE: This medicine is only for you. Do not share this medicine with others. What if I miss a dose? If you miss a dose, take it as soon as you can. If it is almost time for your next dose, take only that dose. Do not take double or extra doses. What may interact with this medicine? Do not take this medicine with any of the following medications:  certain contrast medicines given before X-rays, CT scans, MRI, or other procedures  dofetilide This medicine may also interact with the following medications:  acetazolamide  alcohol  certain antivirals for HIV or hepatitis  certain medicines for blood pressure, heart disease, irregular heart beat  cimetidine  dichlorphenamide  digoxin  diuretics  male hormones, like estrogens or progestins and birth control pills  glycopyrrolate  isoniazid  lamotrigine  memantine  methazolamide  metoclopramide  midodrine  niacin  phenobarbital  phenothiazines like chlorpromazine, mesoridazine, prochlorperazine, thioridazine  phenytoin  ranolazine  rifampin  steroid medicines like prednisone or cortisone  stimulant medicines for attention disorders, weight loss, or to stay awake  thyroid medicines  topiramate  trospium  vandetanib  zonisamide This list may not describe all possible interactions. Give your health care provider a list of all the medicines, herbs,  non-prescription drugs, or dietary supplements you use. Also tell them if you smoke, drink alcohol, or use illegal drugs. Some items may interact with your medicine. What should I watch for while using this medicine? Visit your doctor or health care  professional for regular checks on your progress. This medicine can cause a serious condition in which there is too much acid in the blood. If you develop nausea, vomiting, stomach pain, unusual tiredness, or breathing problems, stop taking this medicine and call your doctor right away. If possible, use a ketone dipstick to check for ketones in your urine. A test called the HbA1C (A1C) will be monitored. This is a simple blood test. It measures your blood sugar control over the last 2 to 3 months. You will receive this test every 3 to 6 months. Learn how to check your blood sugar. Learn the symptoms of low and high blood sugar and how to manage them. Always carry a quick-source of sugar with you in case you have symptoms of low blood sugar. Examples include hard sugar candy or glucose tablets. Make sure others know that you can choke if you eat or drink when you develop serious symptoms of low blood sugar, such as seizures or unconsciousness. They must get medical help at once. Tell your doctor or health care professional if you have high blood sugar. You might need to change the dose of your medicine. If you are sick or exercising more than usual, you might need to change the dose of your medicine. Do not skip meals. Ask your doctor or health care professional if you should avoid alcohol. Many nonprescription cough and cold products contain sugar or alcohol. These can affect blood sugar. This medicine may cause ovulation in premenopausal women who do not have regular monthly periods. This may increase your chances of becoming pregnant. You should not take this medicine if you become pregnant or think you may be pregnant. Talk with your doctor or health care professional about your birth control options while taking this medicine. Contact your doctor or health care professional right away if you think you are pregnant. If you are going to need surgery, a MRI, CT scan, or other procedure, tell your doctor  that you are taking this medicine. You may need to stop taking this medicine before the procedure. Wear a medical ID bracelet or chain, and carry a card that describes your disease and details of your medicine and dosage times. You may see empty tablets in your stool. This is normal. This medicine may cause a decrease in folic acid and vitamin B12. You should make sure that you get enough vitamins while you are taking this medicine. Discuss the foods you eat and the vitamins you take with your health care professional. What side effects may I notice from receiving this medicine? Side effects that you should report to your doctor or health care professional as soon as possible:  allergic reactions like skin rash, itching or hives, swelling of the face, lips, or tongue  breathing problems  dizziness  feeling faint or lightheaded, falls  muscle aches or pains  muscle weakness  penile discharge, itching, or pain in men  signs and symptoms of a genital infection, such as fever; tenderness, redness, or swelling in the genitals or area from the genitals to the back of the rectum  signs and symptoms of low blood sugar such as feeling anxious, confusion, dizziness, increased hunger, unusually weak or tired, sweating, shakiness, cold, irritable, headache, blurred vision,  fast heartbeat, loss of consciousness  signs and symptoms of a urinary tract infection, such as fever, chills, a burning feeling when urinating, blood in the urine, back pain  trouble passing urine or change in the amount of urine, including an urgent need to urinate more often, in larger amounts, or at night  slow or irregular heartbeat  unusual stomach pain or discomfort  unusually tired or weak  vaginal discharge, itching, or odor in women  vomiting Side effects that usually do not require medical attention (report to your doctor or health care professional if they continue or are  bothersome):  diarrhea  headache  heartburn  metallic taste in mouth  mild increase in urination  nausea  stomach gas, upset  thirsty This list may not describe all possible side effects. Call your doctor for medical advice about side effects. You may report side effects to FDA at 1-800-FDA-1088. Where should I keep my medicine? Keep out of the reach of children. Store at room temperature between 15 and 30 degrees C (59 and 86 degrees F). Throw away any unused medicine after the expiration date. NOTE: This sheet is a summary. It may not cover all possible information. If you have questions about this medicine, talk to your doctor, pharmacist, or health care provider.  2020 Elsevier/Gold Standard (2017-11-17 15:19:09)

## 2019-01-29 NOTE — Progress Notes (Signed)
This visit occurred during the SARS-CoV-2 public health emergency.  Safety protocols were in place, including screening questions prior to the visit, additional usage of staff PPE, and extensive cleaning of exam room while observing appropriate contact time as indicated for disinfecting solutions.  Subjective:     Patient ID: Phillip Armstrong , male    DOB: Feb 02, 1965 , 54 y.o.   MRN: [id number]   Chief Complaint  Patient presents with  . Diabetes  . Hypertension    HPI  Diabetes He presents for his follow-up diabetic visit. He has type 2 diabetes mellitus. There are no hypoglycemic associated symptoms. Pertinent negatives for diabetes include no blurred vision and no chest pain. There are no hypoglycemic complications. Risk factors for coronary artery disease include diabetes mellitus, dyslipidemia, hypertension and male sex. He participates in exercise intermittently. An ACE inhibitor/angiotensin II receptor blocker is being taken.  Hypertension This is a chronic problem. The current episode started more than 1 year ago. The problem has been gradually improving since onset. The problem is controlled. Pertinent negatives include no blurred vision or chest pain.     Past Medical History:  Diagnosis Date  . Diabetes mellitus 2000   T2DM  . Hyperlipidemia   . Hypertension      Family History  Problem Relation Age of Onset  . Diabetes Son        T1DM  . Hypertension Other   . Hypertension Father      Current Outpatient Medications:  .  amLODipine (NORVASC) 10 MG tablet, TAKE 1 TABLET BY MOUTH DAILY., Disp: 90 tablet, Rfl: 0 .  aspirin 81 MG tablet, Take 81 mg by mouth daily. , Disp: , Rfl:  .  BD PEN NEEDLE NANO U/F 32G X 4 MM MISC, USE AS DIRECTED, Disp: 100 each, Rfl: 11 .  meloxicam (MOBIC) 15 MG tablet, TAKE 1 TABLET BY MOUTH DAILY AS NEEDED FOR KNEE PAIN, Disp: 30 tablet, Rfl: 1 .  Multiple Vitamins-Minerals (MULTIVITAMIN WITH MINERALS) tablet, Take 1 tablet by mouth  daily., Disp: , Rfl:  .  NYSTATIN powder, APPLY TO THE AFFECTED AREA(S) TWICE DAILY AS DIRECTED, Disp: 60 g, Rfl: 2 .  olmesartan-hydrochlorothiazide (BENICAR HCT) 40-25 MG tablet, Take 1 tablet by mouth daily., Disp: 90 tablet, Rfl: 2 .  OZEMPIC, 1 MG/DOSE, 2 MG/1.5ML SOPN, INJECT 1 MG INTO THE SKIN ONCE A WEEK., Disp: 4.5 mL, Rfl: 1 .  pioglitazone-metformin (ACTOPLUS MET) 15-850 MG tablet, Take 1 tablet by mouth 2 (two) times daily., Disp: 180 tablet, Rfl: 2 .  rosuvastatin (CRESTOR) 20 MG tablet, TAKE 1 TABLET BY MOUTH DAILY, Disp: 90 tablet, Rfl: 2 .  Blood Glucose Monitoring Suppl (FREESTYLE LITE) DEVI, Use as directed to check blood sugars 2 times per day dx: e11.65, Disp: 1 each, Rfl: 1 .  glucose blood (FREESTYLE LITE) test strip, Use as instructed to check blood sugars 2 times per day dx: e11.65, Disp: 150 each, Rfl: 2 .  Lancets (FREESTYLE) lancets, Use as instructed to check blood sugars 2 times per day dx: e11.65, Disp: 150 each, Rfl: 2   No Known Allergies   Review of Systems  Constitutional: Negative.   Eyes: Negative for blurred vision.  Respiratory: Negative.   Cardiovascular: Negative.  Negative for chest pain.  Gastrointestinal: Negative.   Neurological: Negative.   Psychiatric/Behavioral: Negative.      Today's Vitals   01/29/19 1017  BP: 134/86  Pulse: 96  Temp: 98.4 F (36.9 C)  TempSrc: Oral  Weight: 170  lb 6.4 oz (77.3 kg)  Height: 5' 4.6" (1.641 m)   Body mass index is 28.71 kg/m.   Objective:  Physical Exam Vitals and nursing note reviewed.  Constitutional:      Appearance: Normal appearance.  Cardiovascular:     Rate and Rhythm: Normal rate and regular rhythm.     Heart sounds: Normal heart sounds.  Pulmonary:     Effort: Pulmonary effort is normal.     Breath sounds: Normal breath sounds.  Skin:    General: Skin is warm.  Neurological:     General: No focal deficit present.     Mental Status: He is alert.  Psychiatric:        Mood and  Affect: Mood normal.         Assessment And Plan:     1. Uncontrolled type 2 diabetes mellitus with hyperglycemia (HCC)  Chronic. I will check labs as listed below.  We discussed a possible switch to Xigduo from [employer]. He was given samples, he does not wish to switch at this time. Pt advised of cardiac benefits of Xigduo. He is encouraged to let me know when he is about to run out of Actoplusmet, this is when he will switch to the new medication. All questions were answered to his satisfaction.   - Hemoglobin A1c - BMP8+EGFR  2. Essential hypertension  Chronic, controlled. He will continue with current meds. He is encouraged to avoid adding salt to his foods.    3. Overweight (BMI 25.0-29.9)  BMI 28. He is encouraged to aim for at least 150 minutes of exercise per week. He is also encouraged to avoid sugary beverages and processed foods.   Phillip Greenland, MD    THE PATIENT IS ENCOURAGED TO PRACTICE SOCIAL DISTANCING DUE TO THE COVID-19 PANDEMIC.

## 2019-01-30 LAB — BMP8+EGFR
BUN/Creatinine Ratio: 19 (ref 9–20)
BUN: 15 mg/dL (ref 6–24)
CO2: 24 mmol/L (ref 20–29)
Calcium: 9.9 mg/dL (ref 8.7–10.2)
Chloride: 100 mmol/L (ref 96–106)
Creatinine, Ser: 0.8 mg/dL (ref 0.76–1.27)
GFR calc Af Amer: 118 mL/min/{1.73_m2} (ref >59)
GFR calc non Af Amer: 102 mL/min/{1.73_m2} (ref >59)
Glucose: 119 mg/dL — ABNORMAL HIGH (ref 65–99)
Potassium: 3.9 mmol/L (ref 3.5–5.2)
Sodium: 140 mmol/L (ref 134–144)

## 2019-01-30 LAB — HEMOGLOBIN A1C
Est. average glucose Bld gHb Est-mCnc: 154 mg/dL
Hgb A1c MFr Bld: 7 % — ABNORMAL HIGH (ref 4.8–5.6)

## 2019-02-18 ENCOUNTER — Other Ambulatory Visit: Payer: Self-pay | Admitting: Internal Medicine

## 2019-02-18 DIAGNOSIS — I10 Essential (primary) hypertension: Secondary | ICD-10-CM

## 2019-02-18 MED FILL — AMLODIPINE BESYLATE 10 MG T: 10 | 90 days supply | Qty: 90 | Fill #0

## 2019-02-18 MED FILL — OZEMPIC 1 MG/DOSE SOPN: 2 | 84 days supply | Qty: 9 | Fill #1

## 2019-02-26 DIAGNOSIS — H40013 Open angle with borderline findings, low risk, bilateral: Secondary | ICD-10-CM | POA: Diagnosis not present

## 2019-03-05 ENCOUNTER — Other Ambulatory Visit: Payer: Self-pay | Admitting: Internal Medicine

## 2019-03-05 MED FILL — MELOXICAM 15 MG TABLET: 15 | 30 days supply | Qty: 30 | Fill #0

## 2019-03-05 MED FILL — ROSUVASTATIN CALCIUM 20 MG: 20 | 90 days supply | Qty: 90 | Fill #0

## 2019-03-05 MED FILL — OLMESARTAN-HCTZ 40-25 MG TA: 40-25 | 90 days supply | Qty: 90 | Fill #2

## 2019-03-29 MED FILL — PIOGLIT-METFORMIN 15-850: 15-850 | 90 days supply | Qty: 180 | Fill #1

## 2019-04-22 MED FILL — MELOXICAM 15 MG TABLET: 15 | 30 days supply | Qty: 30 | Fill #1

## 2019-04-30 ENCOUNTER — Ambulatory Visit: Payer: 59 | Admitting: Internal Medicine

## 2019-05-21 ENCOUNTER — Other Ambulatory Visit: Payer: Self-pay | Admitting: Internal Medicine

## 2019-05-21 MED FILL — OZEMPIC 1 MG/DOSE SOPN: 2 | 84 days supply | Qty: 9 | Fill #0

## 2019-05-21 MED FILL — AMLODIPINE BESYLATE 10 MG T: 10 | 90 days supply | Qty: 90 | Fill #1

## 2019-05-21 MED FILL — MELOXICAM 15 MG TABLET: 15 | 30 days supply | Qty: 30 | Fill #0

## 2019-05-28 ENCOUNTER — Other Ambulatory Visit: Payer: Self-pay

## 2019-05-28 ENCOUNTER — Encounter: Payer: Self-pay | Admitting: Internal Medicine

## 2019-05-28 ENCOUNTER — Ambulatory Visit: Payer: 59 | Admitting: Internal Medicine

## 2019-05-28 VITALS — BP 112/66 | HR 98 | Temp 98.2°F | Ht 64.6 in | Wt 170.4 lb

## 2019-05-28 DIAGNOSIS — I10 Essential (primary) hypertension: Secondary | ICD-10-CM | POA: Diagnosis not present

## 2019-05-28 DIAGNOSIS — E1165 Type 2 diabetes mellitus with hyperglycemia: Secondary | ICD-10-CM

## 2019-05-28 DIAGNOSIS — E663 Overweight: Secondary | ICD-10-CM | POA: Diagnosis not present

## 2019-05-28 DIAGNOSIS — Z6828 Body mass index (BMI) 28.0-28.9, adult: Secondary | ICD-10-CM | POA: Diagnosis not present

## 2019-05-28 MED ORDER — XIGDUO XR 10-1000 MG PO TB24
1.0000 | ORAL_TABLET | Freq: Every day | ORAL | 1 refills | Status: DC
Start: 2019-05-28 — End: 2019-09-16

## 2019-05-28 NOTE — Progress Notes (Signed)
This visit occurred during the SARS-CoV-2 public health emergency.  Safety protocols were in place, including screening questions prior to the visit, additional usage of staff PPE, and extensive cleaning of exam room while observing appropriate contact time as indicated for disinfecting solutions.  Subjective:     Patient ID: Phillip Armstrong , male    DOB: 26-Apr-1965 , 54 y.o.   MRN: [id number]   Chief Complaint  Patient presents with  . Diabetes  . Hypertension    HPI  He is here today for a diabetes/htn check. He reports compliance with meds. He was taken off of Actoplusmet at his last visit, but he admits that he still took it. He only recently started Xigduo 05/998 once daily. He has not had any issues with the medication.   Diabetes He presents for his follow-up diabetic visit. He has type 2 diabetes mellitus. There are no hypoglycemic associated symptoms. Pertinent negatives for diabetes include no blurred vision and no chest pain. There are no hypoglycemic complications. Risk factors for coronary artery disease include diabetes mellitus, dyslipidemia, hypertension and male sex. He participates in exercise intermittently. An ACE inhibitor/angiotensin II receptor blocker is being taken. Eye exam is current.  Hypertension This is a chronic problem. The current episode started more than 1 year ago. The problem has been gradually improving since onset. The problem is controlled. Pertinent negatives include no blurred vision or chest pain.     Past Medical History:  Diagnosis Date  . Diabetes mellitus 2000   T2DM  . Hyperlipidemia   . Hypertension      Family History  Problem Relation Age of Onset  . Diabetes Son        T1DM  . Hypertension Other   . Hypertension Father      Current Outpatient Medications:  .  amLODipine (NORVASC) 10 MG tablet, TAKE 1 TABLET BY MOUTH DAILY., Disp: 90 tablet, Rfl: 1 .  aspirin 81 MG tablet, Take 81 mg by mouth daily. , Disp: , Rfl:  .  BD  PEN NEEDLE NANO U/F 32G X 4 MM MISC, USE AS DIRECTED, Disp: 100 each, Rfl: 11 .  Blood Glucose Monitoring Suppl (FREESTYLE LITE) DEVI, Use as directed to check blood sugars 2 times per day dx: e11.65, Disp: 1 each, Rfl: 1 .  glucose blood (FREESTYLE LITE) test strip, Use as instructed to check blood sugars 2 times per day dx: e11.65, Disp: 150 each, Rfl: 2 .  Lancets (FREESTYLE) lancets, Use as instructed to check blood sugars 2 times per day dx: e11.65, Disp: 150 each, Rfl: 2 .  meloxicam (MOBIC) 15 MG tablet, TAKE 1 TABLET BY MOUTH DAILY AS NEEDED FOR KNEE PAIN, Disp: 30 tablet, Rfl: 1 .  Multiple Vitamins-Minerals (MULTIVITAMIN WITH MINERALS) tablet, Take 1 tablet by mouth daily., Disp: , Rfl:  .  NYSTATIN powder, APPLY TO THE AFFECTED AREA(S) TWICE DAILY AS DIRECTED, Disp: 60 g, Rfl: 2 .  olmesartan-hydrochlorothiazide (BENICAR HCT) 40-25 MG tablet, Take 1 tablet by mouth daily., Disp: 90 tablet, Rfl: 2 .  OZEMPIC, 1 MG/DOSE, 2 MG/1.5ML SOPN, INJECT 1 MG INTO THE SKIN ONCE A WEEK., Disp: 9 mL, Rfl: 1 .  pioglitazone-metformin (ACTOPLUS MET) 15-850 MG tablet, Take 1 tablet by mouth 2 (two) times daily., Disp: 180 tablet, Rfl: 2 .  rosuvastatin (CRESTOR) 20 MG tablet, TAKE 1 TABLET BY MOUTH DAILY, Disp: 90 tablet, Rfl: 1 .  XIGDUO XR 05-998 MG TB24, Take by mouth 2 (two) times daily., Disp: , Rfl:  No Known Allergies   Review of Systems  Constitutional: Negative.   Eyes: Negative for blurred vision.  Respiratory: Negative.   Cardiovascular: Negative.  Negative for chest pain.  Gastrointestinal: Negative.   Neurological: Negative.   Psychiatric/Behavioral: Negative.      Today's Vitals   05/28/19 1413  BP: 112/66  Pulse: 98  Temp: 98.2 F (36.8 C)  TempSrc: Oral  Weight: 170 lb 6.4 oz (77.3 kg)  Height: 5' 4.6" (1.641 m)   Body mass index is 28.71 kg/m.   Wt Readings from Last 3 Encounters:  05/28/19 170 lb 6.4 oz (77.3 kg)  01/29/19 170 lb 6.4 oz (77.3 kg)  11/06/18 166  lb 12.8 oz (75.7 kg)     Objective:  Physical Exam Vitals and nursing note reviewed.  Constitutional:      Appearance: Normal appearance.  Cardiovascular:     Rate and Rhythm: Normal rate and regular rhythm.     Heart sounds: Normal heart sounds.  Pulmonary:     Effort: Pulmonary effort is normal.     Breath sounds: Normal breath sounds.  Skin:    General: Skin is warm.  Neurological:     General: No focal deficit present.     Mental Status: He is alert.  Psychiatric:        Mood and Affect: Mood normal.         Assessment And Plan:     1. Uncontrolled type 2 diabetes mellitus with hyperglycemia (HCC)  Chronic, I will check labs as listed below. I will likely increase Xigduo 05/998 twice daily after reviewing his labs. I will check renal function today. He will rto in 3 months for re-evaluation. He is encouraged to exercise at least 30 minutes five days per week.   - CMP14+EGFR - Hemoglobin A1c  2. Essential hypertension  Chronic, well controlled. He will continue with current meds. He is encouraged to avoid adding salt to his foods.   3. Overweight with body mass index (BMI) of 28 to 28.9 in adult  He is encouraged to strive for BMI less than 26 to decrease cardiac risk.    [name], MD    THE PATIENT IS ENCOURAGED TO PRACTICE SOCIAL DISTANCING DUE TO THE COVID-19 PANDEMIC.

## 2019-05-28 NOTE — Patient Instructions (Signed)

## 2019-05-29 LAB — CMP14+EGFR
ALT: 18 IU/L (ref 0–44)
AST: 21 IU/L (ref 0–40)
Albumin/Globulin Ratio: 1.8 (ref 1.2–2.2)
Albumin: 4.9 g/dL (ref 3.8–4.9)
Alkaline Phosphatase: 80 IU/L (ref 39–117)
BUN/Creatinine Ratio: 24 — ABNORMAL HIGH (ref 9–20)
BUN: 22 mg/dL (ref 6–24)
Bilirubin Total: 0.5 mg/dL (ref 0.0–1.2)
CO2: 25 mmol/L (ref 20–29)
Calcium: 10.3 mg/dL — ABNORMAL HIGH (ref 8.7–10.2)
Chloride: 99 mmol/L (ref 96–106)
Creatinine, Ser: 0.91 mg/dL (ref 0.76–1.27)
GFR calc Af Amer: 111 mL/min/{1.73_m2} (ref >59)
GFR calc non Af Amer: 96 mL/min/{1.73_m2} (ref >59)
Globulin, Total: 2.7 g/dL (ref 1.5–4.5)
Glucose: 99 mg/dL (ref 65–99)
Potassium: 4 mmol/L (ref 3.5–5.2)
Sodium: 141 mmol/L (ref 134–144)
Total Protein: 7.6 g/dL (ref 6.0–8.5)

## 2019-05-29 LAB — HEMOGLOBIN A1C
Est. average glucose Bld gHb Est-mCnc: 169 mg/dL
Hgb A1c MFr Bld: 7.5 % — ABNORMAL HIGH (ref 4.8–5.6)

## 2019-06-04 ENCOUNTER — Ambulatory Visit: Payer: 59 | Admitting: Internal Medicine

## 2019-06-05 MED FILL — OLMESARTAN-HCTZ 40-25 MG TA: 40-25 | 90 days supply | Qty: 90 | Fill #0

## 2019-06-18 MED FILL — MELOXICAM 15 MG TABLET: 15 | 30 days supply | Qty: 30 | Fill #1

## 2019-06-26 ENCOUNTER — Telehealth: Payer: Self-pay

## 2019-06-26 NOTE — Telephone Encounter (Signed)
The pt was notified that the office doesn't have any samples of xigduo 05/998

## 2019-07-02 ENCOUNTER — Other Ambulatory Visit: Payer: Self-pay

## 2019-07-02 ENCOUNTER — Ambulatory Visit: Payer: 59

## 2019-07-02 VITALS — BP 142/80 | HR 98 | Temp 98.4°F | Ht 65.2 in | Wt 164.2 lb

## 2019-07-02 DIAGNOSIS — I10 Essential (primary) hypertension: Secondary | ICD-10-CM

## 2019-07-02 NOTE — Progress Notes (Signed)
Pt here today for b/p check. Per pt he did not take his b/p meds this morning he was rushing.  Per RS: nurse visit in 2 weeks. need bp check to be when he takes his meds.  let him know if bp up at next visit, I will add another medication  Pt aware

## 2019-07-16 ENCOUNTER — Ambulatory Visit: Payer: 59

## 2019-07-16 ENCOUNTER — Other Ambulatory Visit: Payer: Self-pay

## 2019-07-16 VITALS — BP 130/82 | HR 87 | Temp 98.0°F | Ht 65.2 in | Wt 163.8 lb

## 2019-07-16 DIAGNOSIS — I10 Essential (primary) hypertension: Secondary | ICD-10-CM

## 2019-07-16 NOTE — Progress Notes (Signed)
Pt presents today for b/p check pt is currently taking amlodipine 10MG  at night  olmesartan-HCT 40-25 MG in the morning  Pt b/p today 130/82  Per Dr. Baird Cancer continue with current meds. be sure he has another appt please

## 2019-07-22 ENCOUNTER — Other Ambulatory Visit: Payer: Self-pay | Admitting: Internal Medicine

## 2019-07-22 MED FILL — MELOXICAM 15 MG TABLET: 15 | 30 days supply | Qty: 30 | Fill #0

## 2019-08-12 MED FILL — XIGDUO XR 10 MG-1,000 MG TA: 10-1000 | 30 days supply | Qty: 30 | Fill #1

## 2019-08-23 ENCOUNTER — Other Ambulatory Visit: Payer: Self-pay | Admitting: Internal Medicine

## 2019-08-23 DIAGNOSIS — I10 Essential (primary) hypertension: Secondary | ICD-10-CM

## 2019-08-23 MED FILL — AMLODIPINE BESYLATE 10 MG T: 10 | 90 days supply | Qty: 90 | Fill #0

## 2019-08-23 MED FILL — MELOXICAM 15 MG TABLET: 15 | 30 days supply | Qty: 30 | Fill #1

## 2019-08-26 MED FILL — UNIFINE PENTIPS 32GX5/32: 32G X 4 MM | 90 days supply | Qty: 100 | Fill #1

## 2019-09-02 ENCOUNTER — Ambulatory Visit: Payer: 59 | Admitting: Internal Medicine

## 2019-09-03 ENCOUNTER — Other Ambulatory Visit: Payer: Self-pay

## 2019-09-03 ENCOUNTER — Encounter: Payer: Self-pay | Admitting: Internal Medicine

## 2019-09-03 ENCOUNTER — Ambulatory Visit: Payer: 59 | Admitting: Internal Medicine

## 2019-09-03 VITALS — BP 112/70 | HR 92 | Temp 98.1°F | Ht 65.2 in | Wt 158.8 lb

## 2019-09-03 DIAGNOSIS — E663 Overweight: Secondary | ICD-10-CM

## 2019-09-03 DIAGNOSIS — I10 Essential (primary) hypertension: Secondary | ICD-10-CM

## 2019-09-03 DIAGNOSIS — E1165 Type 2 diabetes mellitus with hyperglycemia: Secondary | ICD-10-CM

## 2019-09-03 DIAGNOSIS — Z6826 Body mass index (BMI) 26.0-26.9, adult: Secondary | ICD-10-CM | POA: Diagnosis not present

## 2019-09-03 DIAGNOSIS — Z1159 Encounter for screening for other viral diseases: Secondary | ICD-10-CM

## 2019-09-03 DIAGNOSIS — H524 Presbyopia: Secondary | ICD-10-CM | POA: Diagnosis not present

## 2019-09-03 LAB — HM DIABETES EYE EXAM

## 2019-09-03 NOTE — Patient Instructions (Signed)
Living With Diabetes Diabetes (type 1 diabetes mellitus or type 2 diabetes mellitus) is a condition in which the body does not have enough of a hormone called insulin, or the body does not respond properly to insulin. Normally, insulin allows sugars (glucose) to enter cells in the body. The cells use glucose for energy. With diabetes, extra glucose builds up in the blood instead of going into cells, which results in high blood glucose (hyperglycemia). How to manage lifestyle changes Managing diabetes includes medical treatments as well as lifestyle changes. If diabetes is not managed well, serious physical and emotional complications can occur. Taking good care of yourself means that you are responsible for:  Monitoring glucose regularly.  Eating a healthy diet.  Exercising regularly.  Meeting with health care providers.  Taking medicines as directed. Some people may feel a lot of stress about managing their diabetes. This is known as emotional distress, and it is very common. Living with diabetes can place you at risk for emotional distress, depression, or anxiety. These disorders can be confusing and can make diabetes management more difficult. How to recognize stress Emotional distress Symptoms of emotional distress include:  Anger about having a diagnosis of diabetes.  Fear or frustration about your diagnosis and the changes you need to make to manage the condition.  Being overly worried about the care that you need or the cost of the care that you need.  Feeling like you caused your condition by doing something wrong.  Fear of unpredictable situations, like low or high blood glucose.  Feeling judged by your health care providers.  Feeling very alone with the disease.  Getting too tired or worn out with the demands of daily care. Depression Having diabetes means that you are at a higher risk for depression. Having depression also means that you are at a higher risk for  diabetes. Your health care provider may test (screen) you for symptoms of depression. It is important to recognize depression symptoms and to start treatment for depression soon after it is diagnosed. The following are some symptoms of depression:  Loss of interest in things that you used to enjoy.  Trouble sleeping, or often waking up early and not being able to get back to sleep.  A change in appetite.  Feeling tired most of the day.  Feeling nervous and anxious.  Feeling guilty and worrying that you are a burden to others.  Feeling depressed more often than you do not feel that way.  Thoughts of hurting yourself or feeling that you want to die. If you have any of these symptoms for 2 weeks or longer, reach out to a health care provider. Follow these instructions at home: Managing emotional distress The following are some ways to manage emotional distress:  Talk with your health care provider or certified diabetes educator. Consider working with a counselor or therapist.  Learn as much as you can about diabetes and its treatment. Meet with a certified diabetes educator or take a class to learn how to manage your condition.  Keep a journal of your thoughts and concerns.  Accept that some things are out of your control.  Talk with other people who have diabetes. It can help to talk with others about the emotional distress that you feel.  Find ways to manage stress that work for you. These may include art or music therapy, exercise, meditation, and hobbies.  Seek support from spiritual leaders, family, and friends. General instructions  Follow your diabetes management plan.  Keep all follow-up visits as told by your health care provider. This is important. Where to find support   Ask your health care provider to recommend a therapist who understands both depression and diabetes.  Search for information and support from the American Diabetes Association:  www.diabetes.org  Find a certified diabetes educator and make an appointment through [place] of Diabetes Educators: www.diabeteseducator.org Get help right away if:  You have thoughts about hurting yourself or others. If you ever feel like you may hurt yourself or others, or have thoughts about taking your own life, get help right away. You can go to your nearest emergency department or call:  Your local emergency services (911 in the [place]  A suicide crisis helpline, such as the [place] at 519-084-1294. This is open 24 hours a day. Summary  Diabetes (type 1 diabetes mellitus or type 2 diabetes mellitus) is a condition in which the body does not have enough of a hormone called insulin, or the body does not respond properly to insulin.  Living with diabetes puts you at risk for medical issues, and it also puts you at risk for emotional issues such as emotional distress, depression, and anxiety.  Recognizing the symptoms of emotional distress and depression may help you avoid problems with your diabetes control. It is important to start treatment for emotional distress and depression soon after they are diagnosed.  Having diabetes means that you are at a higher risk for depression. Ask your health care provider to recommend a therapist who understands both depression and diabetes.  If you experience symptoms of emotional distress or depression, it is important to discuss this with your health care provider, certified diabetes educator, or therapist. This information is not intended to replace advice given to you by your health care provider. Make sure you discuss any questions you have with your health care provider. Document Revised: 01/15/2018 Document Reviewed: 05/19/2016 Elsevier Patient Education  [place].

## 2019-09-03 NOTE — Progress Notes (Signed)
I,Katawbba Wiggins,acting as a Education administrator for [name], MD.,have documented all relevant documentation on the behalf of [name], MD,as directed by  [name], MD while in the presence of [name], MD.  This visit occurred during the SARS-CoV-2 public health emergency.  Safety protocols were in place, including screening questions prior to the visit, additional usage of staff PPE, and extensive cleaning of exam room while observing appropriate contact time as indicated for disinfecting solutions.  Subjective:     Patient ID: Phillip Armstrong , male    DOB: 23-Jul-1965 , 54 y.o.   MRN: [id number]   Chief Complaint  Patient presents with  . Diabetes    HPI  The patient is here today for a follow-up on his diabetes and blood pressure. He reports compliance with meds. He has lost weight, he reports changing his eating habits. He is concerned that the new medication, Merleen Nicely is causing him fast weight loss as well as lower blood sugars. He has had some readings between 87-89, which caused him to feel dizzy.   Diabetes He presents for his follow-up diabetic visit. He has type 2 diabetes mellitus. There are no hypoglycemic associated symptoms. Pertinent negatives for diabetes include no blurred vision and no chest pain. There are no hypoglycemic complications. Risk factors for coronary artery disease include diabetes mellitus, dyslipidemia, hypertension and male sex. He participates in exercise intermittently. An ACE inhibitor/angiotensin II receptor blocker is being taken.  Hypertension This is a chronic problem. The current episode started more than 1 year ago. The problem has been gradually improving since onset. The problem is controlled. Pertinent negatives include no blurred vision or chest pain.     Past Medical History:  Diagnosis Date  . Diabetes mellitus 2000   T2DM  . Hyperlipidemia   . Hypertension      Family History  Problem Relation Age of Onset  .  Diabetes Son        T1DM  . Hypertension Other   . Hypertension Father      Current Outpatient Medications:  .  amLODipine (NORVASC) 10 MG tablet, TAKE 1 TABLET BY MOUTH DAILY., Disp: 90 tablet, Rfl: 1 .  aspirin 81 MG tablet, Take 81 mg by mouth daily. , Disp: , Rfl:  .  BD PEN NEEDLE NANO U/F 32G X 4 MM MISC, USE AS DIRECTED, Disp: 100 each, Rfl: 11 .  Blood Glucose Monitoring Suppl (FREESTYLE LITE) DEVI, Use as directed to check blood sugars 2 times per day dx: e11.65, Disp: 1 each, Rfl: 1 .  Dapagliflozin-metFORMIN HCl ER (XIGDUO XR) 10-998 MG TB24, Take 1 tablet by mouth daily., Disp: 30 tablet, Rfl: 1 .  glucose blood (FREESTYLE LITE) test strip, Use as instructed to check blood sugars 2 times per day dx: e11.65, Disp: 150 each, Rfl: 2 .  Lancets (FREESTYLE) lancets, Use as instructed to check blood sugars 2 times per day dx: e11.65, Disp: 150 each, Rfl: 2 .  meloxicam (MOBIC) 15 MG tablet, TAKE 1 TABLET BY MOUTH DAILY AS NEEDED FOR KNEE PAIN, Disp: 30 tablet, Rfl: 1 .  Multiple Vitamins-Minerals (MULTIVITAMIN WITH MINERALS) tablet, Take 1 tablet by mouth daily., Disp: , Rfl:  .  NYSTATIN powder, APPLY TO THE AFFECTED AREA(S) TWICE DAILY AS DIRECTED, Disp: 60 g, Rfl: 2 .  olmesartan-hydrochlorothiazide (BENICAR HCT) 40-25 MG tablet, Take 1 tablet by mouth daily., Disp: 90 tablet, Rfl: 2 .  OZEMPIC, 1 MG/DOSE, 2 MG/1.5ML SOPN, INJECT 1 MG INTO THE  SKIN ONCE A WEEK., Disp: 9 mL, Rfl: 1 .  rosuvastatin (CRESTOR) 20 MG tablet, TAKE 1 TABLET BY MOUTH DAILY, Disp: 90 tablet, Rfl: 1   No Known Allergies   Review of Systems  Constitutional: Negative.   Eyes: Negative for blurred vision.  Respiratory: Negative.   Cardiovascular: Negative.  Negative for chest pain.  Gastrointestinal: Negative.   Neurological: Negative.   Psychiatric/Behavioral: Negative.   All other systems reviewed and are negative.    Today's Vitals   09/03/19 1409  BP: 112/70  Pulse: 92  Temp: 98.1 F (36.7 C)   TempSrc: Oral  Weight: 158 lb 12.8 oz (72 kg)  Height: 5' 5.2" (1.656 m)   Body mass index is 26.26 kg/m.  Wt Readings from Last 3 Encounters:  09/03/19 158 lb 12.8 oz (72 kg)  07/16/19 163 lb 12.8 oz (74.3 kg)  07/02/19 164 lb 3.2 oz (74.5 kg)   Objective:  Physical Exam Vitals and nursing note reviewed.  Constitutional:      Appearance: Normal appearance.  HENT:     Head: Normocephalic and atraumatic.  Cardiovascular:     Rate and Rhythm: Normal rate and regular rhythm.     Heart sounds: Normal heart sounds.  Pulmonary:     Breath sounds: Normal breath sounds.  Skin:    General: Skin is warm.  Neurological:     General: No focal deficit present.     Mental Status: He is alert and oriented to person, place, and time.         Assessment And Plan:     1. Uncontrolled type 2 diabetes mellitus with hyperglycemia (HCC) Comments: Chronic, I will check labs as listed below. He was congratulated on his lifestyle changes. He would like to decrease dose of Xigduo due to lower BS readings. I plan to decrease metformin portion of the medication. . I will make further recommendations once his labs are available for review.  - BMP8+EGFR - Hemoglobin A1c  2. Essential hypertension Comments: Chronic, well controlled. He will continue with current meds.   3. Overweight with body mass index (BMI) of 26 to 26.9 in adult Comments: He was congratulated on lifestyle changes and his weight loss.  He does not wish to lose any more weight.   4. Encounter for HCV screening test for low risk patient Comments: I will check HCV ab.  - Hepatitis C antibody  Patient was given opportunity to ask questions. Patient verbalized understanding of the plan and was able to repeat key elements of the plan. All questions were answered to their satisfaction.  [name], MD   I, [name], MD, have reviewed all documentation for this visit. The documentation on 09/03/19 for the exam,  diagnosis, procedures, and orders are all accurate and complete.  THE PATIENT IS ENCOURAGED TO PRACTICE SOCIAL DISTANCING DUE TO THE COVID-19 PANDEMIC.

## 2019-09-04 LAB — BMP8+EGFR
BUN/Creatinine Ratio: 26 — ABNORMAL HIGH (ref 9–20)
BUN: 23 mg/dL (ref 6–24)
CO2: 28 mmol/L (ref 20–29)
Calcium: 10.3 mg/dL — ABNORMAL HIGH (ref 8.7–10.2)
Chloride: 97 mmol/L (ref 96–106)
Creatinine, Ser: 0.88 mg/dL (ref 0.76–1.27)
GFR calc Af Amer: 113 mL/min/{1.73_m2} (ref >59)
GFR calc non Af Amer: 98 mL/min/{1.73_m2} (ref >59)
Glucose: 174 mg/dL — ABNORMAL HIGH (ref 65–99)
Potassium: 3.8 mmol/L (ref 3.5–5.2)
Sodium: 140 mmol/L (ref 134–144)

## 2019-09-04 LAB — HEMOGLOBIN A1C
Est. average glucose Bld gHb Est-mCnc: 160 mg/dL
Hgb A1c MFr Bld: 7.2 % — ABNORMAL HIGH (ref 4.8–5.6)

## 2019-09-04 LAB — HEPATITIS C ANTIBODY: Hep C Virus Ab: <0.1 s/co ratio (ref 0.0–0.9)

## 2019-09-05 ENCOUNTER — Encounter: Payer: Self-pay | Admitting: Internal Medicine

## 2019-09-06 MED FILL — OLMESARTAN-HCTZ 40-25 MG TA: 40-25 | 90 days supply | Qty: 90 | Fill #1

## 2019-09-06 MED FILL — OZEMPIC (1 MG/DOSE) 4 MG/3M: 4 | 84 days supply | Qty: 9 | Fill #0

## 2019-09-12 ENCOUNTER — Telehealth: Payer: Self-pay

## 2019-09-12 ENCOUNTER — Other Ambulatory Visit: Payer: Self-pay

## 2019-09-12 MED ORDER — FREESTYLE LITE DEVI: 1 refills | Status: AC

## 2019-09-12 MED ORDER — XIGDUO XR 10-500 MG PO TB24: ORAL_TABLET | ORAL | 1 refills | Status: DC

## 2019-09-12 MED FILL — FREESTYLE LITE METER: 1 days supply | Qty: 1 | Fill #0

## 2019-09-12 MED FILL — XIGDUO XR 10-500 MG TB24: 10-500 | 30 days supply | Qty: 30 | Fill #0

## 2019-09-12 NOTE — Telephone Encounter (Signed)
The pt was told that Dr. Baird Cancer is going to keep the pt on the xigduo 10/998 mg daily and the pt said that he didn't want to stay on that dose that he wanted his metformin dose lowered because he has been loosing weight and that he discussed that with Dr. Baird Cancer at his visit.  The pt was told that Dr. Baird Cancer was keeping the pt on the 10/998 dose because of his a1c not being at goal.  The pt declined to stay on the recommended dose. Xigduo 10/500 mg daily was faxed to the pt's pharmacy.

## 2019-09-16 ENCOUNTER — Other Ambulatory Visit: Payer: Self-pay

## 2019-09-16 ENCOUNTER — Telehealth: Payer: Self-pay

## 2019-09-16 MED ORDER — XIGDUO XR 5-1000 MG PO TB24: 1.0000 | ORAL_TABLET | Freq: Every day | ORAL | 0 refills | Status: DC

## 2019-09-16 MED FILL — XIGDUO XR 5 MG-1,000 MG TAB: 5-1000 | 90 days supply | Qty: 90 | Fill #0

## 2019-09-16 NOTE — Telephone Encounter (Signed)
Patient called stating that the xigduo 10/998 was too much for him and he didn't like the way it makes him feel. Explained to him that his A1C was still too high. He was very specific about what he wanted and was not understanding to anything else. I explain to him that this was not what the dr advised and that lowering his dose would be against medical advise. Pt insisted on being given xigduo 05/998

## 2019-09-24 ENCOUNTER — Other Ambulatory Visit: Payer: Self-pay | Admitting: Internal Medicine

## 2019-09-25 MED FILL — MELOXICAM 15 MG TABLET: 15 | 30 days supply | Qty: 30 | Fill #0

## 2019-09-25 MED FILL — ROSUVASTATIN CALCIUM 20 MG: 20 | 90 days supply | Qty: 90 | Fill #0

## 2019-10-30 MED FILL — MELOXICAM 15 MG TABLET: 15 | 30 days supply | Qty: 30 | Fill #1

## 2019-11-14 ENCOUNTER — Other Ambulatory Visit: Payer: Self-pay | Admitting: Internal Medicine

## 2019-11-14 MED FILL — OZEMPIC (1 MG/DOSE) 4 MG/3M: 4 | 84 days supply | Qty: 9 | Fill #0

## 2019-11-18 ENCOUNTER — Other Ambulatory Visit: Payer: Self-pay | Admitting: Internal Medicine

## 2019-11-18 ENCOUNTER — Ambulatory Visit: Payer: 59 | Admitting: Internal Medicine

## 2019-11-18 ENCOUNTER — Encounter: Payer: Self-pay | Admitting: Internal Medicine

## 2019-11-18 ENCOUNTER — Other Ambulatory Visit: Payer: Self-pay

## 2019-11-18 VITALS — BP 124/76 | HR 87 | Temp 98.4°F | Ht 65.6 in | Wt 157.6 lb

## 2019-11-18 DIAGNOSIS — Z0000 Encounter for general adult medical examination without abnormal findings: Secondary | ICD-10-CM | POA: Diagnosis not present

## 2019-11-18 DIAGNOSIS — E1165 Type 2 diabetes mellitus with hyperglycemia: Secondary | ICD-10-CM

## 2019-11-18 DIAGNOSIS — I10 Essential (primary) hypertension: Secondary | ICD-10-CM | POA: Diagnosis not present

## 2019-11-18 LAB — POCT URINALYSIS DIPSTICK
Bilirubin, UA: NEGATIVE
Blood, UA: NEGATIVE
Glucose, UA: POSITIVE — AB
Ketones, UA: NEGATIVE
Leukocytes, UA: NEGATIVE
Nitrite, UA: NEGATIVE
Protein, UA: NEGATIVE
Spec Grav, UA: 1.02 (ref 1.010–1.025)
Urobilinogen, UA: 0.2 E.U./dL
pH, UA: 7 (ref 5.0–8.0)

## 2019-11-18 LAB — POC HEMOCCULT BLD/STL (OFFICE/1-CARD/DIAGNOSTIC): Fecal Occult Blood, POC: NEGATIVE

## 2019-11-18 LAB — POCT UA - MICROALBUMIN
Albumin/Creatinine Ratio, Urine, POC: <30
Creatinine, POC: 100 mg/dL
Microalbumin Ur, POC: 10 mg/L

## 2019-11-18 MED ORDER — AMLODIPINE BESYLATE 10 MG PO TABS: 10.0000 mg | ORAL_TABLET | Freq: Every day | ORAL | 1 refills | Status: DC

## 2019-11-18 MED ORDER — XIGDUO XR 5-1000 MG PO TB24
1.0000 | ORAL_TABLET | Freq: Every day | ORAL | 2 refills | Status: DC
Start: 2019-11-18 — End: 2019-11-18

## 2019-11-18 MED FILL — AMLODIPINE BESYLATE 10 MG T: 10 | 90 days supply | Qty: 90 | Fill #0

## 2019-11-18 NOTE — Patient Instructions (Signed)

## 2019-11-18 NOTE — Progress Notes (Signed)
I,Katawbba Wiggins,acting as a Education administrator for Phillip Greenland, Phillip Armstrong.,have documented all relevant documentation on the behalf of Phillip Greenland, Phillip Armstrong,as directed by  Phillip Greenland, Phillip Armstrong while in the presence of Phillip Greenland, Phillip Armstrong.  This visit occurred during the SARS-CoV-2 public health emergency.  Safety protocols were in place, including screening questions prior to the visit, additional usage of staff PPE, and extensive cleaning of exam room while observing appropriate contact time as indicated for disinfecting solutions.  Subjective:     Patient ID: Phillip Armstrong , male    DOB: 1965/07/17 , 54 y.o.   MRN: [id number]   Chief Complaint  Patient presents with  . Annual Exam  . Diabetes  . Hypertension    HPI  He is here today for a full physical examination. He has no specific concerns or complaints at this time. He reports compliance with meds. Denies headaches, chest pain and shortness of breath.   Diabetes He presents for his follow-up diabetic visit. He has type 2 diabetes mellitus. There are no hypoglycemic associated symptoms. There are no diabetic associated symptoms. Pertinent negatives for diabetes include no blurred vision and no chest pain. There are no diabetic complications. An ACE inhibitor/angiotensin II receptor blocker is being taken. Eye exam is current.  Hypertension This is a chronic problem. The current episode started yesterday. The problem has been gradually improving since onset. The problem is controlled. Pertinent negatives include no blurred vision, chest pain, palpitations or shortness of breath. Risk factors for coronary artery disease include dyslipidemia and diabetes mellitus. Past treatments include angiotensin blockers and diuretics. The current treatment provides moderate improvement.     Past Medical History:  Diagnosis Date  . Diabetes mellitus 2000   T2DM  . Hyperlipidemia   . Hypertension      Family History  Problem Relation Age of Onset  .  Diabetes Son        T1DM  . Hypertension Other   . Hypertension Father      Current Outpatient Medications:  .  amLODipine (NORVASC) 10 MG tablet, Take 1 tablet (10 mg total) by mouth daily., Disp: 90 tablet, Rfl: 1 .  aspirin 81 MG tablet, Take 81 mg by mouth daily. , Disp: , Rfl:  .  BD PEN NEEDLE NANO U/F 32G X 4 MM MISC, USE AS DIRECTED, Disp: 100 each, Rfl: 11 .  Blood Glucose Monitoring Suppl (FREESTYLE LITE) DEVI, Use as directed to check blood sugars 2 times per day dx: e11.65, Disp: 1 each, Rfl: 1 .  Dapagliflozin-metFORMIN HCl ER (XIGDUO XR) 05-998 MG TB24, Take 1 tablet by mouth daily., Disp: 90 tablet, Rfl: 2 .  glucose blood (FREESTYLE LITE) test strip, Use as instructed to check blood sugars 2 times per day dx: e11.65, Disp: 150 each, Rfl: 2 .  Lancets (FREESTYLE) lancets, Use as instructed to check blood sugars 2 times per day dx: e11.65, Disp: 150 each, Rfl: 2 .  meloxicam (MOBIC) 15 MG tablet, TAKE 1 TABLET BY MOUTH DAILY AS NEEDED FOR KNEE PAIN, Disp: 30 tablet, Rfl: 1 .  Multiple Vitamins-Minerals (MULTIVITAMIN WITH MINERALS) tablet, Take 1 tablet by mouth daily., Disp: , Rfl:  .  NYSTATIN powder, APPLY TO THE AFFECTED AREA(S) TWICE DAILY AS DIRECTED, Disp: 60 g, Rfl: 2 .  olmesartan-hydrochlorothiazide (BENICAR HCT) 40-25 MG tablet, Take 1 tablet by mouth daily., Disp: 90 tablet, Rfl: 2 .  OZEMPIC, 1 MG/DOSE, 2 MG/1.5ML SOPN, INJECT 1 MG INTO THE SKIN ONCE A WEEK., Disp:  9 mL, Rfl: 1 .  rosuvastatin (CRESTOR) 20 MG tablet, TAKE 1 TABLET BY MOUTH DAILY, Disp: 90 tablet, Rfl: 1   No Known Allergies   Men's preventive visit. Patient Health Questionnaire (PHQ-2) is    Office Visit from 11/18/2019 in [facility]  PHQ-2 Total Score 0    . Patient is on a healthy diet. Marital status: Single. Relevant history for alcohol use is:  Social History   Substance and Sexual Activity  Alcohol Use No  . Relevant history for tobacco use is:  Social History    Tobacco Use  Smoking Status Never Smoker  Smokeless Tobacco Never Used  .   Review of Systems  Constitutional: Negative.   HENT: Negative.   Eyes: Negative.  Negative for blurred vision.  Respiratory: Negative.  Negative for shortness of breath.   Cardiovascular: Negative.  Negative for chest pain and palpitations.  Gastrointestinal: Negative.   Endocrine: Negative.   Genitourinary: Negative.   Musculoskeletal: Negative.   Skin: Negative.   Allergic/Immunologic: Negative.   Neurological: Negative.   Hematological: Negative.   Psychiatric/Behavioral: Negative.      Today's Vitals   11/18/19 0958  BP: 124/76  Pulse: 87  Temp: 98.4 F (36.9 C)  TempSrc: Oral  Weight: 157 lb 9.6 oz (71.5 kg)  Height: 5' 5.6" (1.666 m)   Body mass index is 25.75 kg/m.  Wt Readings from Last 3 Encounters:  11/18/19 157 lb 9.6 oz (71.5 kg)  09/03/19 158 lb 12.8 oz (72 kg)  07/16/19 163 lb 12.8 oz (74.3 kg)   Objective:  Physical Exam Vitals and nursing note reviewed.  Constitutional:      Appearance: Normal appearance.  HENT:     Head: Normocephalic and atraumatic.     Right Ear: Tympanic membrane, ear canal and external ear normal.     Left Ear: Tympanic membrane, ear canal and external ear normal.     Nose:     Comments: Deferred, masked    Mouth/Throat:     Comments: Deferred,masked Eyes:     Extraocular Movements: Extraocular movements intact.     Conjunctiva/sclera: Conjunctivae normal.     Pupils: Pupils are equal, round, and reactive to light.  Cardiovascular:     Rate and Rhythm: Normal rate and regular rhythm.     Pulses:          Dorsalis pedis pulses are 3+ on the right side and 3+ on the left side.     Heart sounds: Normal heart sounds.  Pulmonary:     Effort: Pulmonary effort is normal.     Breath sounds: Normal breath sounds.  Chest:     Breasts:        Right: Normal. No swelling, bleeding, inverted nipple, mass or nipple discharge.        Left: Normal. No  swelling, bleeding, inverted nipple, mass or nipple discharge.  Abdominal:     General: Abdomen is flat. Bowel sounds are normal.     Palpations: Abdomen is soft.  Genitourinary:    Prostate: Normal.     Rectum: Normal. Guaiac result negative.  Musculoskeletal:        General: Normal range of motion.     Cervical back: Normal range of motion and neck supple.  Feet:     Right foot:     Protective Sensation: 5 sites tested. 5 sites sensed.     Skin integrity: Dry skin present.     Toenail Condition: Right toenails are normal.  Left foot:     Protective Sensation: 5 sites tested. 5 sites sensed.     Skin integrity: Dry skin present.     Toenail Condition: Left toenails are normal.     Comments: Tinea pedis between 4th/5th toes on left foot Skin:    General: Skin is warm.  Neurological:     General: No focal deficit present.     Mental Status: He is alert and oriented to person, place, and time.  Psychiatric:        Mood and Affect: Mood normal.        Behavior: Behavior normal.         Assessment And Plan:    1. Annual physical exam Comments: A full exam was performed. DRE performed, stool heme negative. PATIENT IS ADVISED TO GET 30-45 MINUTES REGULAR EXERCISE NO LESS THAN FOUR TO FIVE DAYS PER WEEK - BOTH WEIGHTBEARING EXERCISES AND AEROBIC ARE RECOMMENDED.  PATIENT IS ADVISED TO FOLLOW A HEALTHY DIET WITH AT LEAST SIX FRUITS/VEGGIES PER DAY, DECREASE INTAKE OF RED MEAT, AND TO INCREASE FISH INTAKE TO TWO DAYS PER WEEK.  MEATS/FISH SHOULD NOT BE FRIED, BAKED OR BROILED IS PREFERABLE.  I SUGGEST WEARING SPF 50 SUNSCREEN ON EXPOSED PARTS AND ESPECIALLY WHEN IN THE DIRECT SUNLIGHT FOR AN EXTENDED PERIOD OF TIME.  PLEASE AVOID FAST FOOD RESTAURANTS AND INCREASE YOUR WATER INTAKE.  - CBC - Hemoglobin A1c - CMP14+EGFR - PSA - Lipid panel - POC Hemoccult Bld/Stl (1-Cd Office Dx)  2. Uncontrolled type 2 diabetes mellitus with hyperglycemia ([place]) Comments: Diabetic foot exam was  performed.  He will RTO in four months for re-evaluation.  I DISCUSSED WITH THE PATIENT AT LENGTH REGARDING THE GOALS OF GLYCEMIC CONTROL AND POSSIBLE LONG-TERM COMPLICATIONS.  I  ALSO STRESSED THE IMPORTANCE OF COMPLIANCE WITH HOME GLUCOSE MONITORING, DIETARY RESTRICTIONS INCLUDING AVOIDANCE OF SUGARY DRINKS/PROCESSED FOODS,  ALONG WITH REGULAR EXERCISE.  I  ALSO STRESSED THE IMPORTANCE OF ANNUAL EYE EXAMS, SELF FOOT CARE AND COMPLIANCE WITH OFFICE VISITS.   - POCT Urinalysis Dipstick (81002) - POCT UA - Microalbumin  3. Essential hypertension Comments: Chronic, well controlled. EKG performed, NSR w/o acute changes. Encouraged to avoid adding salt to his foods. Refill for meds sent to pharmacy.  - EKG 12-Lead - amLODipine (NORVASC) 10 MG tablet; Take 1 tablet (10 mg total) by mouth daily.  Dispense: 90 tablet; Refill: 1     Patient was given opportunity to ask questions. Patient verbalized understanding of the plan and was able to repeat key elements of the plan. All questions were answered to their satisfaction.   Phillip Greenland, Phillip Armstrong   I, Phillip Greenland, Phillip Armstrong, have reviewed all documentation for this visit. The documentation on 11/18/19 for the exam, diagnosis, procedures, and orders are all accurate and complete.  THE PATIENT IS ENCOURAGED TO PRACTICE SOCIAL DISTANCING DUE TO THE COVID-19 PANDEMIC.

## 2019-11-19 LAB — CBC
Hematocrit: 47.5 % (ref 37.5–51.0)
Hemoglobin: 15.6 g/dL (ref 13.0–17.7)
MCH: 26 pg — ABNORMAL LOW (ref 26.6–33.0)
MCHC: 32.8 g/dL (ref 31.5–35.7)
MCV: 79 fL (ref 79–97)
Platelets: 293 10*3/uL (ref 150–450)
RBC: 6.01 x10E6/uL — ABNORMAL HIGH (ref 4.14–5.80)
RDW: 13.7 % (ref 11.6–15.4)
WBC: 4.8 10*3/uL (ref 3.4–10.8)

## 2019-11-19 LAB — CMP14+EGFR
ALT: 28 IU/L (ref 0–44)
AST: 26 IU/L (ref 0–40)
Albumin/Globulin Ratio: 2 (ref 1.2–2.2)
Albumin: 4.9 g/dL (ref 3.8–4.9)
Alkaline Phosphatase: 109 IU/L (ref 44–121)
BUN/Creatinine Ratio: 22 — ABNORMAL HIGH (ref 9–20)
BUN: 17 mg/dL (ref 6–24)
Bilirubin Total: 1 mg/dL (ref 0.0–1.2)
CO2: 26 mmol/L (ref 20–29)
Calcium: 10.2 mg/dL (ref 8.7–10.2)
Chloride: 99 mmol/L (ref 96–106)
Creatinine, Ser: 0.79 mg/dL (ref 0.76–1.27)
GFR calc Af Amer: 119 mL/min/{1.73_m2} (ref >59)
GFR calc non Af Amer: 103 mL/min/{1.73_m2} (ref >59)
Globulin, Total: 2.5 g/dL (ref 1.5–4.5)
Glucose: 114 mg/dL — ABNORMAL HIGH (ref 65–99)
Potassium: 3.8 mmol/L (ref 3.5–5.2)
Sodium: 139 mmol/L (ref 134–144)
Total Protein: 7.4 g/dL (ref 6.0–8.5)

## 2019-11-19 LAB — LIPID PANEL
Chol/HDL Ratio: 3.2 ratio (ref 0.0–5.0)
Cholesterol, Total: 139 mg/dL (ref 100–199)
HDL: 44 mg/dL (ref >39)
LDL Chol Calc (NIH): 81 mg/dL (ref 0–99)
Triglycerides: 70 mg/dL (ref 0–149)
VLDL Cholesterol Cal: 14 mg/dL (ref 5–40)

## 2019-11-19 LAB — HEMOGLOBIN A1C
Est. average glucose Bld gHb Est-mCnc: 169 mg/dL
Hgb A1c MFr Bld: 7.5 % — ABNORMAL HIGH (ref 4.8–5.6)

## 2019-11-19 LAB — PSA: Prostate Specific Ag, Serum: 0.4 ng/mL (ref 0.0–4.0)

## 2019-11-25 ENCOUNTER — Other Ambulatory Visit: Payer: Self-pay | Admitting: Internal Medicine

## 2019-11-25 MED FILL — OLMESARTAN-HCTZ 40-25 MG TA: 40-25 | 90 days supply | Qty: 90 | Fill #2

## 2019-11-25 MED FILL — MELOXICAM 15 MG TABLET: 15 | 30 days supply | Qty: 30 | Fill #0

## 2019-12-09 MED FILL — ROSUVASTATIN CALCIUM 20 MG: 20 | 90 days supply | Qty: 90 | Fill #1

## 2019-12-16 MED FILL — XIGDUO XR 5 MG-1,000 MG TAB: 5-1000 | 90 days supply | Qty: 90 | Fill #0

## 2019-12-30 MED FILL — MELOXICAM 15 MG TABLET: 15 | 30 days supply | Qty: 30 | Fill #1

## 2020-01-31 ENCOUNTER — Other Ambulatory Visit: Payer: Self-pay | Admitting: Internal Medicine

## 2020-02-03 ENCOUNTER — Other Ambulatory Visit: Payer: Self-pay | Admitting: Internal Medicine

## 2020-02-04 ENCOUNTER — Other Ambulatory Visit: Payer: Self-pay | Admitting: Internal Medicine

## 2020-02-04 MED FILL — OZEMPIC (1 MG/DOSE) 4 MG/3M: 4 | 84 days supply | Qty: 9 | Fill #0

## 2020-02-04 NOTE — Telephone Encounter (Signed)
Meloxicam refill 

## 2020-02-05 MED FILL — MELOXICAM 15 MG TABLET: 15 | 30 days supply | Qty: 30 | Fill #0

## 2020-03-03 DIAGNOSIS — E119 Type 2 diabetes mellitus without complications: Secondary | ICD-10-CM | POA: Diagnosis not present

## 2020-03-03 DIAGNOSIS — H40013 Open angle with borderline findings, low risk, bilateral: Secondary | ICD-10-CM | POA: Diagnosis not present

## 2020-03-05 ENCOUNTER — Other Ambulatory Visit: Payer: Self-pay | Admitting: Internal Medicine

## 2020-03-05 MED FILL — ROSUVASTATIN CALCIUM 20 MG: 20 | 90 days supply | Qty: 90 | Fill #0

## 2020-03-05 MED FILL — AMLODIPINE BESYLATE 10 MG T: 10 | 90 days supply | Qty: 90 | Fill #1

## 2020-03-05 MED FILL — OLMESARTAN-HCTZ 40-25 MG TA: 40-25 | 90 days supply | Qty: 90 | Fill #0

## 2020-03-17 ENCOUNTER — Ambulatory Visit: Payer: 59 | Admitting: Internal Medicine

## 2020-03-17 MED FILL — XIGDUO XR 5 MG-1,000 MG TAB: 5-1000 | 90 days supply | Qty: 90 | Fill #1

## 2020-03-17 MED FILL — MELOXICAM 15 MG TABLET: 15 | 30 days supply | Qty: 30 | Fill #1

## 2020-04-07 ENCOUNTER — Other Ambulatory Visit: Payer: Self-pay | Admitting: Internal Medicine

## 2020-04-08 ENCOUNTER — Other Ambulatory Visit: Payer: Self-pay | Admitting: Internal Medicine

## 2020-04-08 MED FILL — UNIFINE PENTIPS 32GX5/32: 32G X 4 MM | 90 days supply | Qty: 100 | Fill #0

## 2020-04-10 ENCOUNTER — Other Ambulatory Visit: Payer: Self-pay | Admitting: Internal Medicine

## 2020-04-10 MED FILL — MELOXICAM 15 MG TABLET: 15 | 30 days supply | Qty: 30 | Fill #0

## 2020-05-06 ENCOUNTER — Other Ambulatory Visit: Payer: Self-pay

## 2020-05-06 ENCOUNTER — Ambulatory Visit: Payer: 59 | Admitting: Nurse Practitioner

## 2020-05-06 ENCOUNTER — Other Ambulatory Visit (HOSPITAL_COMMUNITY): Payer: Self-pay

## 2020-05-06 VITALS — BP 142/78 | HR 92 | Temp 98.1°F | Ht 65.0 in | Wt 158.8 lb

## 2020-05-06 DIAGNOSIS — W57XXXA Bitten or stung by nonvenomous insect and other nonvenomous arthropods, initial encounter: Secondary | ICD-10-CM | POA: Diagnosis not present

## 2020-05-06 DIAGNOSIS — S30860A Insect bite (nonvenomous) of lower back and pelvis, initial encounter: Secondary | ICD-10-CM

## 2020-05-06 MED ORDER — DOXYCYCLINE MONOHYDRATE 100 MG PO CAPS
100.0000 mg | ORAL_CAPSULE | Freq: Two times a day (BID) | ORAL | 0 refills | Status: AC
  Filled 2020-05-06: qty 20, 10d supply, fill #0

## 2020-05-06 NOTE — Progress Notes (Signed)
I,Tianna Badgett,acting as a Education administrator for [employer], NP.,have documented all relevant documentation on the behalf of [employer], NP,as directed by  Bary Castilla, NP while in the presence of Bary Castilla, NP.  This visit occurred during the SARS-CoV-2 public health emergency.  Safety protocols were in place, including screening questions prior to the visit, additional usage of staff PPE, and extensive cleaning of exam room while observing appropriate contact time as indicated for disinfecting solutions.  Subjective:     Patient ID: Phillip Armstrong , male    DOB: 1965/11/15 , 55 y.o.   MRN: [id number]   Chief Complaint  Patient presents with  . Mass    HPI  Patient is here for "skin tag" on his back. He noticed this about 2 weeks ago when he was showering. He reports that It doesn't hurt him or bothers him. But when you press on it, it does bother him.  Lately, he has been working out in the yard and cutting trees. He does not have a fever, chills or body aches or any other symptoms. The patient does not have any other concerns and just wanted to get it checked out.     Past Medical History:  Diagnosis Date  . Diabetes mellitus 2000   T2DM  . Hyperlipidemia   . Hypertension      Family History  Problem Relation Age of Onset  . Diabetes Son        T1DM  . Hypertension Other   . Hypertension Father      Current Outpatient Medications:  .  doxycycline (MONODOX) 100 MG capsule, Take 1 capsule (100 mg total) by mouth 2 (two) times daily for 10 days., Disp: 20 capsule, Rfl: 0 .  amLODipine (NORVASC) 10 MG tablet, TAKE 1 TABLET (10 MG TOTAL) BY MOUTH DAILY., Disp: 90 tablet, Rfl: 1 .  aspirin 81 MG tablet, Take 81 mg by mouth daily. , Disp: , Rfl:  .  Blood Glucose Monitoring Suppl (FREESTYLE LITE) DEVI, Use as directed to check blood sugars 2 times per day dx: e11.65, Disp: 1 each, Rfl: 1 .  Dapagliflozin-metFORMIN HCl ER 05-998 MG TB24, TAKE 1 TABLET BY MOUTH  DAILY., Disp: 90 tablet, Rfl: 2 .  glucose blood (FREESTYLE LITE) test strip, Use as instructed to check blood sugars 2 times per day dx: e11.65, Disp: 150 each, Rfl: 2 .  Insulin Pen Needle 32G X 4 MM MISC, USE AS DIRECTED, Disp: 100 each, Rfl: 11 .  Lancets (FREESTYLE) lancets, Use as instructed to check blood sugars 2 times per day dx: e11.65, Disp: 150 each, Rfl: 2 .  meloxicam (MOBIC) 15 MG tablet, TAKE 1 TABLET BY MOUTH DAILY AS NEEDED FOR KNEE PAIN, Disp: 30 tablet, Rfl: 1 .  Multiple Vitamins-Minerals (MULTIVITAMIN WITH MINERALS) tablet, Take 1 tablet by mouth daily., Disp: , Rfl:  .  NYSTATIN powder, APPLY TO THE AFFECTED AREA(S) TWICE DAILY AS DIRECTED, Disp: 60 g, Rfl: 2 .  olmesartan-hydrochlorothiazide (BENICAR HCT) 40-25 MG tablet, TAKE 1 TABLET BY MOUTH DAILY., Disp: 90 tablet, Rfl: 2 .  rosuvastatin (CRESTOR) 20 MG tablet, TAKE 1 TABLET BY MOUTH DAILY, Disp: 90 tablet, Rfl: 1 .  Semaglutide, 1 MG/DOSE, 4 MG/3ML SOPN, INJECT 1MG  INTO THE SKIN ONCE A WEEK, Disp: 9 mL, Rfl: 2   No Known Allergies   Review of Systems  Constitutional: Negative.  Negative for chills and fever.  HENT: Negative for congestion.   Respiratory: Negative.  Negative for cough, chest tightness, shortness  of breath and wheezing.   Cardiovascular: Negative.  Negative for chest pain and palpitations.  Gastrointestinal: Negative.   Musculoskeletal: Negative for arthralgias and myalgias.  Skin: Negative for color change.  Neurological: Negative.      Today's Vitals   05/06/20 0857  BP: (!) 142/78  Pulse: 92  Temp: 98.1 F (36.7 C)  TempSrc: Oral  Weight: 158 lb 12.8 oz (72 kg)  Height: 5\' 5"  (1.651 m)  PainSc: 0-No pain   Body mass index is 26.43 kg/m.   Objective:  Physical Exam Constitutional:      Appearance: Normal appearance.  HENT:     Head: Normocephalic and atraumatic.  Cardiovascular:     Rate and Rhythm: Normal rate and regular rhythm.     Pulses: Normal pulses.     Heart sounds:  Normal heart sounds. No murmur heard.   Pulmonary:     Effort: Pulmonary effort is normal. No respiratory distress.     Breath sounds: Normal breath sounds. No wheezing.  Skin:    General: Skin is warm and dry.     Capillary Refill: Capillary refill takes less than 2 seconds.       Neurological:     Mental Status: He is alert and oriented to person, place, and time.  Psychiatric:        Mood and Affect: Mood normal.        Behavior: Behavior normal.        Thought Content: Thought content normal.         Assessment And Plan:     1. Tick bite of back, initial encounter -The tick was pulled out from the patient's mid-back by applying betadine to clean and using tweezers to pull out the tick. Gauze was applied after removing the tick. The ticks head and body was examined to make sure it was completely pulled out of the patient.  -The patient will be treated with antx to prevent lyme disease and infection.  -Patient was educated about the side-effects and symptoms of the antx  -Advised patient to follow up if area gets reddened or swollen or if he experiences any fever, chills or bodyaches.   - doxycycline (MONODOX) 100 MG capsule; Take 1 capsule (100 mg total) by mouth 2 (two) times daily for 10 days.  Dispense: 20 capsule; Refill: 0   Follow up: If symptoms get worse   Patient was given opportunity to ask questions. Patient verbalized understanding of the plan and was able to repeat key elements of the plan. All questions were answered to their satisfaction.  Bary Castilla, DNP   I, Bary Castilla, DNP  have reviewed all documentation for this visit. The documentation on 05/06/20 for the exam, diagnosis, procedures, and orders are all accurate and complete.   IF YOU HAVE BEEN REFERRED TO A SPECIALIST, IT MAY TAKE 1-2 WEEKS TO SCHEDULE/PROCESS THE REFERRAL. IF YOU HAVE NOT HEARD FROM US/SPECIALIST IN TWO WEEKS, PLEASE GIVE [place] A CALL AT [phone].   THE PATIENT IS  ENCOURAGED TO PRACTICE SOCIAL DISTANCING DUE TO THE COVID-19 PANDEMIC.

## 2020-05-12 ENCOUNTER — Other Ambulatory Visit (HOSPITAL_COMMUNITY): Payer: Self-pay

## 2020-05-12 MED FILL — Rosuvastatin Calcium Tab 20 MG: ORAL | 90 days supply | Qty: 90 | Fill #0 | Status: CN

## 2020-05-12 MED FILL — Meloxicam Tab 15 MG: ORAL | 30 days supply | Qty: 30 | Fill #0 | Status: AC

## 2020-05-17 MED FILL — Rosuvastatin Calcium Tab 20 MG: ORAL | 90 days supply | Qty: 90 | Fill #0 | Status: CN

## 2020-05-18 ENCOUNTER — Other Ambulatory Visit (HOSPITAL_COMMUNITY): Payer: Self-pay

## 2020-05-18 MED FILL — Rosuvastatin Calcium Tab 20 MG: ORAL | 30 days supply | Qty: 30 | Fill #0 | Status: AC

## 2020-05-19 ENCOUNTER — Other Ambulatory Visit: Payer: Self-pay

## 2020-05-19 ENCOUNTER — Encounter: Payer: Self-pay | Admitting: Internal Medicine

## 2020-05-19 ENCOUNTER — Ambulatory Visit: Payer: 59 | Admitting: Internal Medicine

## 2020-05-19 VITALS — BP 114/82 | HR 77 | Temp 97.5°F | Ht 65.0 in | Wt 157.8 lb

## 2020-05-19 DIAGNOSIS — E663 Overweight: Secondary | ICD-10-CM | POA: Diagnosis not present

## 2020-05-19 DIAGNOSIS — I10 Essential (primary) hypertension: Secondary | ICD-10-CM

## 2020-05-19 DIAGNOSIS — E1165 Type 2 diabetes mellitus with hyperglycemia: Secondary | ICD-10-CM | POA: Diagnosis not present

## 2020-05-19 DIAGNOSIS — E7800 Pure hypercholesterolemia, unspecified: Secondary | ICD-10-CM | POA: Diagnosis not present

## 2020-05-19 NOTE — Patient Instructions (Signed)

## 2020-05-19 NOTE — Progress Notes (Signed)
I,Tianna Badgett,acting as a Education administrator for [name], MD.,have documented all relevant documentation on the behalf of [name], MD,as directed by  [name], MD while in the presence of [name], MD.  This visit occurred during the SARS-CoV-2 public health emergency.  Safety protocols were in place, including screening questions prior to the visit, additional usage of staff PPE, and extensive cleaning of exam room while observing appropriate contact time as indicated for disinfecting solutions.  Subjective:     Patient ID: Phillip Armstrong , male    DOB: [birth year] , 55 y.o.   MRN: [id number]   Chief Complaint  Patient presents with  . Hypertension  . Diabetes    HPI  He is here today for HTN/DM check. He has no specific concerns or complaints at this time. He reports compliance with meds. He reports his sugars range from 87-130. He feels well on current regimen.   Diabetes He presents for his follow-up diabetic visit. He has type 2 diabetes mellitus. There are no hypoglycemic associated symptoms. There are no diabetic associated symptoms. Pertinent negatives for diabetes include no blurred vision and no chest pain. There are no diabetic complications. An ACE inhibitor/angiotensin II receptor blocker is being taken. Eye exam is current.  Hypertension This is a chronic problem. The current episode started yesterday. The problem has been gradually improving since onset. The problem is controlled. Pertinent negatives include no blurred vision, chest pain, palpitations or shortness of breath. Risk factors for coronary artery disease include dyslipidemia and diabetes mellitus. Past treatments include angiotensin blockers and diuretics. The current treatment provides moderate improvement.     Past Medical History:  Diagnosis Date  . Diabetes mellitus 2000   T2DM  . Hyperlipidemia   . Hypertension      Family History  Problem Relation Age of Onset  . Diabetes Son         T1DM  . Hypertension Other   . Hypertension Father      Current Outpatient Medications:  .  amLODipine (NORVASC) 10 MG tablet, TAKE 1 TABLET (10 MG TOTAL) BY MOUTH DAILY., Disp: 90 tablet, Rfl: 1 .  aspirin 81 MG tablet, Take 81 mg by mouth daily., Disp: , Rfl:  .  Blood Glucose Monitoring Suppl (FREESTYLE LITE) DEVI, Use as directed to check blood sugars 2 times per day dx: e11.65, Disp: 1 each, Rfl: 1 .  Dapagliflozin-metFORMIN HCl ER 05-998 MG TB24, TAKE 1 TABLET BY MOUTH DAILY., Disp: 90 tablet, Rfl: 2 .  glucose blood (FREESTYLE LITE) test strip, Use as instructed to check blood sugars 2 times per day dx: e11.65, Disp: 150 each, Rfl: 2 .  Insulin Pen Needle 32G X 4 MM MISC, USE AS DIRECTED, Disp: 100 each, Rfl: 11 .  Lancets (FREESTYLE) lancets, Use as instructed to check blood sugars 2 times per day dx: e11.65, Disp: 150 each, Rfl: 2 .  meloxicam (MOBIC) 15 MG tablet, TAKE 1 TABLET BY MOUTH DAILY AS NEEDED FOR KNEE PAIN, Disp: 30 tablet, Rfl: 1 .  Multiple Vitamins-Minerals (MULTIVITAMIN WITH MINERALS) tablet, Take 1 tablet by mouth daily., Disp: , Rfl:  .  NYSTATIN powder, APPLY TO THE AFFECTED AREA(S) TWICE DAILY AS DIRECTED, Disp: 60 g, Rfl: 2 .  olmesartan-hydrochlorothiazide (BENICAR HCT) 40-25 MG tablet, TAKE 1 TABLET BY MOUTH DAILY., Disp: 90 tablet, Rfl: 2 .  rosuvastatin (CRESTOR) 20 MG tablet, TAKE 1 TABLET BY MOUTH DAILY, Disp: 90 tablet, Rfl: 1 .  Semaglutide, 1 MG/DOSE, 4  MG/3ML SOPN, INJECT $RemoveBefo'1MG'FnPXnNAakoE$  INTO THE SKIN ONCE A WEEK, Disp: 9 mL, Rfl: 2   No Known Allergies   Review of Systems  Constitutional: Positive for unexpected weight change.       He is concerned about weight loss.  Thinks his meds are causing weight loss. Also admits to improved eating habits.   Eyes: Negative for blurred vision.  Respiratory: Negative.  Negative for shortness of breath.   Cardiovascular: Negative.  Negative for chest pain and palpitations.  Gastrointestinal: Negative.    Neurological: Negative.      Today's Vitals   05/19/20 0851  BP: 114/82  Pulse: 77  Temp: (!) 97.5 F (36.4 C)  TempSrc: Oral  Weight: 157 lb 12.8 oz (71.6 kg)  Height: $Remove'5\' 5"'ewdaVUc$  (1.651 m)   Body mass index is 26.26 kg/m.  Wt Readings from Last 3 Encounters:  05/19/20 157 lb 12.8 oz (71.6 kg)  05/06/20 158 lb 12.8 oz (72 kg)  11/18/19 157 lb 9.6 oz (71.5 kg)    Objective:  Physical Exam Vitals and nursing note reviewed.  Constitutional:      Appearance: Normal appearance.  HENT:     Head: Normocephalic and atraumatic.     Nose:     Comments: Masked     Mouth/Throat:     Comments: Masked  Cardiovascular:     Rate and Rhythm: Normal rate and regular rhythm.     Heart sounds: Normal heart sounds.  Pulmonary:     Effort: Pulmonary effort is normal.     Breath sounds: Normal breath sounds.  Musculoskeletal:     Cervical back: Normal range of motion.  Skin:    General: Skin is warm.  Neurological:     General: No focal deficit present.     Mental Status: He is alert.  Psychiatric:        Mood and Affect: Mood normal.         Assessment And Plan:     1. Essential hypertension Comments: Well controlled. He will continue with current meds. Will consider decreasing HCT since he is also on Xigduo.   2. Uncontrolled type 2 diabetes mellitus with hyperglycemia ([facility]) Comments: Chronic. I will check labs as listed below. I will consider decreasing dose of metformin at his request, he is concerned about weight loss. He will rto in 3 months for re-evaluation.  - Hemoglobin A1c - CMP14+EGFR - TSH  3. Pure hypercholesterolemia Comments: I will check fasting lipid panel and LFTs today. Encouraged to limit his intake of fried foods.  - Lipid panel  4. Overweight (BMI 25.0-29.9) Comments: BMI 26. He thinks he has lost too much weight. Advised that he is the same weight he was 6 months ago. States he feels more comfortable at 170 lbs. I will consider making med changes once  I have reviewed his labs. I will also check TSh today.     Patient was given opportunity to ask questions. Patient verbalized understanding of the plan and was able to repeat key elements of the plan. All questions were answered to their satisfaction.   I, [name], MD, have reviewed all documentation for this visit. The documentation on 05/19/20 for the exam, diagnosis, procedures, and orders are all accurate and complete.   IF YOU HAVE BEEN REFERRED TO A SPECIALIST, IT MAY TAKE 1-2 WEEKS TO SCHEDULE/PROCESS THE REFERRAL. IF YOU HAVE NOT HEARD FROM US/SPECIALIST IN TWO WEEKS, PLEASE GIVE [place] A CALL AT [phone].   THE  PATIENT IS ENCOURAGED TO PRACTICE SOCIAL DISTANCING DUE TO THE COVID-19 PANDEMIC.   

## 2020-05-20 LAB — CMP14+EGFR
ALT: 30 IU/L (ref 0–44)
AST: 29 IU/L (ref 0–40)
Albumin/Globulin Ratio: 1.9 (ref 1.2–2.2)
Albumin: 4.9 g/dL (ref 3.8–4.9)
Alkaline Phosphatase: 109 IU/L (ref 44–121)
BUN/Creatinine Ratio: 31 — ABNORMAL HIGH (ref 9–20)
BUN: 24 mg/dL (ref 6–24)
Bilirubin Total: 0.7 mg/dL (ref 0.0–1.2)
CO2: 24 mmol/L (ref 20–29)
Calcium: 10.4 mg/dL — ABNORMAL HIGH (ref 8.7–10.2)
Chloride: 100 mmol/L (ref 96–106)
Creatinine, Ser: 0.77 mg/dL (ref 0.76–1.27)
Globulin, Total: 2.6 g/dL (ref 1.5–4.5)
Glucose: 126 mg/dL — ABNORMAL HIGH (ref 65–99)
Potassium: 4.1 mmol/L (ref 3.5–5.2)
Sodium: 141 mmol/L (ref 134–144)
Total Protein: 7.5 g/dL (ref 6.0–8.5)
eGFR: 106 mL/min/{1.73_m2} (ref >59)

## 2020-05-20 LAB — TSH: TSH: 2.8 u[IU]/mL (ref 0.450–4.500)

## 2020-05-20 LAB — HEMOGLOBIN A1C
Est. average glucose Bld gHb Est-mCnc: 174 mg/dL
Hgb A1c MFr Bld: 7.7 % — ABNORMAL HIGH (ref 4.8–5.6)

## 2020-05-20 LAB — LIPID PANEL
Chol/HDL Ratio: 2.7 ratio (ref 0.0–5.0)
Cholesterol, Total: 134 mg/dL (ref 100–199)
HDL: 50 mg/dL (ref >39)
LDL Chol Calc (NIH): 65 mg/dL (ref 0–99)
Triglycerides: 103 mg/dL (ref 0–149)
VLDL Cholesterol Cal: 19 mg/dL (ref 5–40)

## 2020-05-26 ENCOUNTER — Telehealth: Payer: Self-pay

## 2020-05-26 NOTE — Telephone Encounter (Signed)
-----   Message from Glendale Chard, MD sent at 05/25/2020  5:07 PM EDT ----- I do not use that class of medication anymore since it can increase risk of heart failure. Does he want to move forward with Endocrinology appt?   RS ----- Message ----- From: Michelle Nasuti, [place] Sent: 05/22/2020  12:02 PM EDT To: Glendale Chard, MD  The pt wants to know why he just can't go back on the actosplusmet

## 2020-05-26 NOTE — Telephone Encounter (Signed)
I left the pt a message to call the office back. 

## 2020-05-27 ENCOUNTER — Telehealth: Payer: Self-pay

## 2020-05-27 NOTE — Telephone Encounter (Signed)
The pt no longer wants to take xigduo due to weight loss and to see an endocrinologist.

## 2020-05-29 ENCOUNTER — Other Ambulatory Visit (HOSPITAL_COMMUNITY): Payer: Self-pay

## 2020-05-29 ENCOUNTER — Telehealth: Payer: Self-pay

## 2020-05-29 MED ORDER — METFORMIN HCL ER 750 MG PO TB24
750.0000 mg | ORAL_TABLET | Freq: Two times a day (BID) | ORAL | 1 refills | Status: DC
Start: 2020-05-29 — End: 2020-06-02
  Filled 2020-05-29: qty 60, 30d supply, fill #0

## 2020-05-29 MED ORDER — SEMAGLUTIDE (1 MG/DOSE) 4 MG/3ML ~~LOC~~ SOPN
1.0000 mg | PEN_INJECTOR | SUBCUTANEOUS | 2 refills | Status: DC
  Filled 2020-05-29: qty 9, 84d supply, fill #0
  Filled 2020-09-16: qty 9, 84d supply, fill #1
  Filled 2020-12-17: qty 9, 84d supply, fill #2

## 2020-05-29 NOTE — Telephone Encounter (Signed)
The pt said he needed a  Refill on ozempic and a prescription for the metformin er 750 mg BID sent to his pharmacy.  Medications were sent to his pharmacy.

## 2020-06-02 ENCOUNTER — Other Ambulatory Visit: Payer: Self-pay

## 2020-06-02 ENCOUNTER — Other Ambulatory Visit (HOSPITAL_COMMUNITY): Payer: Self-pay

## 2020-06-02 MED ORDER — METFORMIN HCL ER 750 MG PO TB24
750.0000 mg | ORAL_TABLET | Freq: Two times a day (BID) | ORAL | 1 refills | Status: DC
  Filled 2020-06-02: qty 60, 30d supply, fill #0

## 2020-06-02 NOTE — Telephone Encounter (Signed)
The pt was notified that Dr. Baird Cancer doesn't use the actosplusmet medication anymore because it can increase risk for heart disease.

## 2020-06-03 ENCOUNTER — Other Ambulatory Visit (HOSPITAL_COMMUNITY): Payer: Self-pay

## 2020-06-04 ENCOUNTER — Other Ambulatory Visit (HOSPITAL_COMMUNITY): Payer: Self-pay

## 2020-06-04 MED FILL — Olmesartan Medoxomil-Hydrochlorothiazide Tab 40-25 MG: ORAL | 90 days supply | Qty: 90 | Fill #0 | Status: AC

## 2020-06-05 ENCOUNTER — Other Ambulatory Visit (HOSPITAL_COMMUNITY): Payer: Self-pay

## 2020-06-08 ENCOUNTER — Other Ambulatory Visit (HOSPITAL_COMMUNITY): Payer: Self-pay

## 2020-06-08 ENCOUNTER — Other Ambulatory Visit: Payer: Self-pay | Admitting: Internal Medicine

## 2020-06-08 ENCOUNTER — Telehealth: Payer: Self-pay

## 2020-06-08 MED ORDER — METFORMIN HCL ER 500 MG PO TB24
1000.0000 mg | ORAL_TABLET | Freq: Two times a day (BID) | ORAL | 3 refills | Status: DC
Start: 2020-06-08 — End: 2020-09-07
  Filled 2020-06-08: qty 120, 30d supply, fill #0
  Filled 2020-06-30: qty 120, 30d supply, fill #1
  Filled 2020-07-08: qty 360, 90d supply, fill #1

## 2020-06-08 MED ORDER — METFORMIN HCL ER (OSM) 1000 MG PO TB24
1000.0000 mg | ORAL_TABLET | Freq: Two times a day (BID) | ORAL | 1 refills | Status: DC
  Filled 2020-06-08: qty 180, fill #0

## 2020-06-08 MED ORDER — METFORMIN HCL ER 500 MG PO TB24
500.0000 mg | ORAL_TABLET | Freq: Every day | ORAL | 3 refills | Status: DC
  Filled 2020-06-08: qty 30, 30d supply, fill #0

## 2020-06-08 NOTE — Telephone Encounter (Signed)
-----   Message from Glendale Chard, MD sent at 06/08/2020  3:07 PM EDT ----- You can send in metformin XR 1000mg  one tab po twice daily #60/1 refill ----- Message ----- From: Michelle Nasuti, CMA Sent: 06/08/2020   9:54 AM EDT To: Glendale Chard, MD  He wants to know if he can take 1000 mg 2 times per day of the metformin, the 750 mg of the metformin 2 times per day isn't keeping his sugars controlled, in the am running around 134 - 135, in the evening its around 128.

## 2020-06-11 ENCOUNTER — Other Ambulatory Visit (HOSPITAL_COMMUNITY): Payer: Self-pay

## 2020-06-11 ENCOUNTER — Other Ambulatory Visit: Payer: Self-pay | Admitting: Internal Medicine

## 2020-06-11 DIAGNOSIS — I10 Essential (primary) hypertension: Secondary | ICD-10-CM

## 2020-06-11 MED ORDER — AMLODIPINE BESYLATE 10 MG PO TABS
10.0000 mg | ORAL_TABLET | Freq: Every day | ORAL | 1 refills | Status: DC
  Filled 2020-06-11: qty 90, 90d supply, fill #0
  Filled 2020-09-16: qty 90, 90d supply, fill #1

## 2020-06-12 ENCOUNTER — Other Ambulatory Visit: Payer: Self-pay | Admitting: Internal Medicine

## 2020-06-12 ENCOUNTER — Other Ambulatory Visit (HOSPITAL_COMMUNITY): Payer: Self-pay

## 2020-06-16 MED ORDER — FREESTYLE LITE TEST VI STRP
ORAL_STRIP | 2 refills | Status: DC
  Filled 2020-06-16: qty 150, 75d supply, fill #0
  Filled 2021-01-03: qty 150, 75d supply, fill #1
  Filled 2021-05-14: qty 150, 75d supply, fill #2

## 2020-06-16 MED ORDER — FREESTYLE LANCETS MISC
2 refills | Status: AC
  Filled 2020-06-16: qty 100, 50d supply, fill #0

## 2020-06-17 ENCOUNTER — Other Ambulatory Visit (HOSPITAL_COMMUNITY): Payer: Self-pay

## 2020-06-22 ENCOUNTER — Other Ambulatory Visit (HOSPITAL_COMMUNITY): Payer: Self-pay

## 2020-06-22 ENCOUNTER — Other Ambulatory Visit: Payer: Self-pay | Admitting: Internal Medicine

## 2020-06-23 ENCOUNTER — Other Ambulatory Visit (HOSPITAL_COMMUNITY): Payer: Self-pay

## 2020-06-23 MED ORDER — MELOXICAM 15 MG PO TABS
15.0000 mg | ORAL_TABLET | Freq: Every day | ORAL | 1 refills | Status: DC | PRN
  Filled 2020-06-23: qty 30, 30d supply, fill #0
  Filled 2020-07-08 – 2020-07-21 (×3): qty 30, 30d supply, fill #1

## 2020-06-30 ENCOUNTER — Other Ambulatory Visit (HOSPITAL_COMMUNITY): Payer: Self-pay

## 2020-06-30 MED FILL — Rosuvastatin Calcium Tab 20 MG: ORAL | 90 days supply | Qty: 90 | Fill #1 | Status: AC

## 2020-06-30 MED FILL — Rosuvastatin Calcium Tab 20 MG: ORAL | 30 days supply | Qty: 30 | Fill #1 | Status: CN

## 2020-07-08 ENCOUNTER — Other Ambulatory Visit (HOSPITAL_COMMUNITY): Payer: Self-pay

## 2020-07-21 ENCOUNTER — Other Ambulatory Visit (HOSPITAL_COMMUNITY): Payer: Self-pay

## 2020-07-24 ENCOUNTER — Other Ambulatory Visit (HOSPITAL_COMMUNITY): Payer: Self-pay

## 2020-08-20 ENCOUNTER — Other Ambulatory Visit: Payer: Self-pay | Admitting: Internal Medicine

## 2020-08-20 ENCOUNTER — Other Ambulatory Visit (HOSPITAL_COMMUNITY): Payer: Self-pay

## 2020-08-21 ENCOUNTER — Other Ambulatory Visit: Payer: Self-pay | Admitting: Internal Medicine

## 2020-08-21 ENCOUNTER — Other Ambulatory Visit (HOSPITAL_COMMUNITY): Payer: Self-pay

## 2020-08-24 ENCOUNTER — Other Ambulatory Visit: Payer: Self-pay

## 2020-08-24 ENCOUNTER — Other Ambulatory Visit (HOSPITAL_COMMUNITY): Payer: Self-pay

## 2020-08-24 ENCOUNTER — Other Ambulatory Visit: Payer: Self-pay | Admitting: Internal Medicine

## 2020-08-24 MED ORDER — NYSTATIN 100000 UNIT/GM EX POWD
CUTANEOUS | 2 refills | Status: DC
Start: 2020-08-24 — End: 2023-01-25
  Filled 2020-08-24: qty 60, 15d supply, fill #0

## 2020-08-24 MED ORDER — MELOXICAM 15 MG PO TABS
15.0000 mg | ORAL_TABLET | Freq: Every day | ORAL | 1 refills | Status: DC | PRN
  Filled 2020-08-24: qty 30, 30d supply, fill #0
  Filled 2020-09-23: qty 30, 30d supply, fill #1

## 2020-08-26 ENCOUNTER — Other Ambulatory Visit (HOSPITAL_COMMUNITY): Payer: Self-pay

## 2020-08-27 ENCOUNTER — Ambulatory Visit: Payer: 59 | Admitting: Internal Medicine

## 2020-09-03 ENCOUNTER — Other Ambulatory Visit (HOSPITAL_COMMUNITY): Payer: Self-pay

## 2020-09-03 DIAGNOSIS — H5203 Hypermetropia, bilateral: Secondary | ICD-10-CM | POA: Diagnosis not present

## 2020-09-03 LAB — HM DIABETES EYE EXAM

## 2020-09-03 MED FILL — Olmesartan Medoxomil-Hydrochlorothiazide Tab 40-25 MG: ORAL | 90 days supply | Qty: 90 | Fill #1 | Status: AC

## 2020-09-04 ENCOUNTER — Other Ambulatory Visit (HOSPITAL_COMMUNITY): Payer: Self-pay

## 2020-09-04 ENCOUNTER — Encounter: Payer: Self-pay | Admitting: Internal Medicine

## 2020-09-07 ENCOUNTER — Other Ambulatory Visit: Payer: Self-pay

## 2020-09-07 ENCOUNTER — Encounter: Payer: Self-pay | Admitting: Internal Medicine

## 2020-09-07 ENCOUNTER — Ambulatory Visit: Payer: 59 | Admitting: Internal Medicine

## 2020-09-07 ENCOUNTER — Other Ambulatory Visit (HOSPITAL_COMMUNITY): Payer: Self-pay

## 2020-09-07 VITALS — BP 118/66 | HR 82 | Temp 98.1°F | Ht 65.0 in | Wt 157.6 lb

## 2020-09-07 DIAGNOSIS — I10 Essential (primary) hypertension: Secondary | ICD-10-CM | POA: Diagnosis not present

## 2020-09-07 DIAGNOSIS — E1165 Type 2 diabetes mellitus with hyperglycemia: Secondary | ICD-10-CM

## 2020-09-07 DIAGNOSIS — Z6826 Body mass index (BMI) 26.0-26.9, adult: Secondary | ICD-10-CM

## 2020-09-07 DIAGNOSIS — Z114 Encounter for screening for human immunodeficiency virus [HIV]: Secondary | ICD-10-CM | POA: Diagnosis not present

## 2020-09-07 DIAGNOSIS — E663 Overweight: Secondary | ICD-10-CM | POA: Diagnosis not present

## 2020-09-07 DIAGNOSIS — Z23 Encounter for immunization: Secondary | ICD-10-CM | POA: Diagnosis not present

## 2020-09-07 MED ORDER — OLMESARTAN MEDOXOMIL-HCTZ 40-25 MG PO TABS
1.0000 | ORAL_TABLET | Freq: Every day | ORAL | 2 refills | Status: DC
  Filled 2020-09-07 – 2020-12-01 (×2): qty 90, 90d supply, fill #0
  Filled 2021-02-26: qty 90, 90d supply, fill #1
  Filled 2021-05-27: qty 90, 90d supply, fill #2

## 2020-09-07 MED ORDER — SHINGRIX 50 MCG/0.5ML IM SUSR
0.5000 mL | INTRAMUSCULAR | 1 refills | Status: DC
  Filled 2020-09-07: qty 0.5, 1d supply, fill #0
  Filled 2020-11-09 (×2): qty 0.5, 1d supply, fill #1

## 2020-09-07 MED ORDER — METFORMIN HCL 1000 MG PO TABS
1000.0000 mg | ORAL_TABLET | Freq: Two times a day (BID) | ORAL | 1 refills | Status: DC
  Filled 2020-09-07 – 2020-10-07 (×2): qty 180, 90d supply, fill #0
  Filled 2021-01-03: qty 180, 90d supply, fill #1

## 2020-09-07 NOTE — Patient Instructions (Signed)

## 2020-09-07 NOTE — Progress Notes (Signed)
I,Tianna Badgett,acting as a Education administrator for [name], MD.,have documented all relevant documentation on the behalf of [name], MD,as directed by  [name], MD while in the presence of [name], MD.  This visit occurred during the SARS-CoV-2 public health emergency.  Safety protocols were in place, including screening questions prior to the visit, additional usage of staff PPE, and extensive cleaning of exam room while observing appropriate contact time as indicated for disinfecting solutions.  Subjective:     Patient ID: Phillip Armstrong , male    DOB: 03/18/1965 , 55 y.o.   MRN: [id number]   Chief Complaint  Patient presents with   Diabetes   Hypertension    HPI  He is here today for HTN/DM check. He has no specific concerns or complaints at this time. He reports compliance with meds. He reports his sugars range from 87-130. He feels well on current regimen. He was placed on Farxiga in the past, he stopped it b/c he did not like the weight loss. I offered to d/c metformin - but he did not want to stay on meds despite its cardiac benefits.    Diabetes He presents for his follow-up diabetic visit. He has type 2 diabetes mellitus. There are no hypoglycemic associated symptoms. There are no diabetic associated symptoms. Pertinent negatives for diabetes include no blurred vision and no chest pain. There are no diabetic complications. An ACE inhibitor/angiotensin II receptor blocker is being taken. Eye exam is current.  Hypertension This is a chronic problem. The current episode started yesterday. The problem has been gradually improving since onset. The problem is controlled. Pertinent negatives include no blurred vision, chest pain, palpitations or shortness of breath. Risk factors for coronary artery disease include dyslipidemia and diabetes mellitus. Past treatments include angiotensin blockers and diuretics. The current treatment provides moderate improvement.    Past  Medical History:  Diagnosis Date   Diabetes mellitus 2000   T2DM   Hyperlipidemia    Hypertension      Family History  Problem Relation Age of Onset   Diabetes Son        T1DM   Hypertension Other    Hypertension Father      Current Outpatient Medications:    amLODipine (NORVASC) 10 MG tablet, Take 1 tablet (10 mg total) by mouth daily., Disp: 90 tablet, Rfl: 1   aspirin 81 MG tablet, Take 81 mg by mouth daily., Disp: , Rfl:    Blood Glucose Monitoring Suppl (FREESTYLE LITE) DEVI, Use as directed to check blood sugars 2 times per day dx: e11.65, Disp: 1 each, Rfl: 1   glucose blood (FREESTYLE LITE) test strip, Use as instructed to check blood sugars 2 times daily., Disp: 150 each, Rfl: 2   Insulin Pen Needle 32G X 4 MM MISC, USE AS DIRECTED, Disp: 100 each, Rfl: 11   Lancets (FREESTYLE) lancets, Use as instructed to check blood sugars 2 times daily., Disp: 150 each, Rfl: 2   meloxicam (MOBIC) 15 MG tablet, Take 1 tablet (15 mg total) by mouth daily as needed for knee pain., Disp: 30 tablet, Rfl: 1   metFORMIN (GLUCOPHAGE) 1000 MG tablet, Take 1 tablet (1,000 mg total) by mouth 2 (two) times daily with a meal., Disp: 180 tablet, Rfl: 1   Multiple Vitamins-Minerals (MULTIVITAMIN WITH MINERALS) tablet, Take 1 tablet by mouth daily., Disp: , Rfl:    nystatin powder, APPLY TO THE AFFECTED AREA(S) TWICE DAILY AS DIRECTED, Disp: 60 g, Rfl: 2  rosuvastatin (CRESTOR) 20 MG tablet, TAKE 1 TABLET BY MOUTH DAILY, Disp: 90 tablet, Rfl: 1   Semaglutide, 1 MG/DOSE, 4 MG/3ML SOPN, Inject 1 mg into the skin once a week., Disp: 9 mL, Rfl: 2   Zoster Vaccine Adjuvanted (SHINGRIX) injection, Inject 0.5 mLs into the muscle once for 1 dose., Disp: 0.5 mL, Rfl: 0   olmesartan-hydrochlorothiazide (BENICAR HCT) 40-25 MG tablet, TAKE 1 TABLET BY MOUTH DAILY., Disp: 90 tablet, Rfl: 2   No Known Allergies   Review of Systems  Constitutional: Negative.   Eyes:  Negative for blurred vision.  Respiratory:  Negative.  Negative for shortness of breath.   Cardiovascular: Negative.  Negative for chest pain and palpitations.  Gastrointestinal: Negative.   Neurological: Negative.     Today's Vitals   09/07/20 0836  BP: 118/66  Pulse: 82  Temp: 98.1 F (36.7 C)  TempSrc: Oral  Weight: 157 lb 9.6 oz (71.5 kg)  Height: _0  (1.651 m)   Body mass index is 26.23 kg/m.  Wt Readings from Last 3 Encounters:  09/07/20 157 lb 9.6 oz (71.5 kg)  05/19/20 157 lb 12.8 oz (71.6 kg)  05/06/20 158 lb 12.8 oz (72 kg)    Objective:  Physical Exam Vitals and nursing note reviewed.  Constitutional:      Appearance: Normal appearance.  HENT:     Head: Normocephalic and atraumatic.     Nose:     Comments: Masked     Mouth/Throat:     Comments: Masked  Cardiovascular:     Rate and Rhythm: Normal rate and regular rhythm.     Heart sounds: Normal heart sounds.  Pulmonary:     Effort: Pulmonary effort is normal.     Breath sounds: Normal breath sounds.  Skin:    General: Skin is warm.  Neurological:     General: No focal deficit present.     Mental Status: He is alert.  Psychiatric:        Mood and Affect: Mood normal.        Assessment And Plan:     1. Uncontrolled type 2 diabetes mellitus with hyperglycemia ([place]) Comments: I will check labs as listed below. I will change him to metformin 1071m bid as requested. He is advised that this may cause GI distress since XR not avail. He will rto in Nov 2022 for his next physical exam.  - Hemoglobin A1c - BMP8+EGFR  2. Essential hypertension Comments: Chronic, well controlled. No need to change meds. He is encouraged to follow low sodium diet.   3. Overweight with body mass index (BMI) of 26 to 26.9 in adult Comments: BMI is stable. He is encouraged to strive for 150 minutes of exercise per week.   4. Immunization due Comments: I will send Shingrix to local pharmacy.   5. Encounter for screening for HIV Comments: He agrees to HIV testing.   - HIV Antibody (routine testing w rflx)   Patient was given opportunity to ask questions. Patient verbalized understanding of the plan and was able to repeat key elements of the plan. All questions were answered to their satisfaction.   I, RMaximino Greenland MD, have reviewed all documentation for this visit. The documentation on 09/07/20 for the exam, diagnosis, procedures, and orders are all accurate and complete.   IF YOU HAVE BEEN REFERRED TO A SPECIALIST, IT MAY TAKE 1-2 WEEKS TO SCHEDULE/PROCESS THE REFERRAL. IF YOU HAVE NOT HEARD FROM US/SPECIALIST IN TWO WEEKS, PLEASE GIVE [place]  CALL AT [phone].   THE PATIENT IS ENCOURAGED TO PRACTICE SOCIAL DISTANCING DUE TO THE COVID-19 PANDEMIC.

## 2020-09-08 LAB — BMP8+EGFR
BUN/Creatinine Ratio: 20 (ref 9–20)
BUN: 16 mg/dL (ref 6–24)
CO2: 26 mmol/L (ref 20–29)
Calcium: 9.9 mg/dL (ref 8.7–10.2)
Chloride: 98 mmol/L (ref 96–106)
Creatinine, Ser: 0.82 mg/dL (ref 0.76–1.27)
Glucose: 137 mg/dL — ABNORMAL HIGH (ref 65–99)
Potassium: 4.1 mmol/L (ref 3.5–5.2)
Sodium: 139 mmol/L (ref 134–144)
eGFR: 104 mL/min/{1.73_m2} (ref >59)

## 2020-09-08 LAB — HEMOGLOBIN A1C
Est. average glucose Bld gHb Est-mCnc: 171 mg/dL
Hgb A1c MFr Bld: 7.6 % — ABNORMAL HIGH (ref 4.8–5.6)

## 2020-09-08 LAB — HIV ANTIBODY (ROUTINE TESTING W REFLEX): HIV Screen 4th Generation wRfx: NONREACTIVE

## 2020-09-16 MED FILL — Insulin Pen Needle 32 G X 4 MM (1/6" or 5/32"): 90 days supply | Qty: 100 | Fill #0 | Status: AC

## 2020-09-17 ENCOUNTER — Other Ambulatory Visit (HOSPITAL_COMMUNITY): Payer: Self-pay

## 2020-09-23 ENCOUNTER — Other Ambulatory Visit (HOSPITAL_COMMUNITY): Payer: Self-pay

## 2020-10-06 ENCOUNTER — Other Ambulatory Visit: Payer: Self-pay | Admitting: Internal Medicine

## 2020-10-07 ENCOUNTER — Other Ambulatory Visit (HOSPITAL_COMMUNITY): Payer: Self-pay

## 2020-10-07 MED ORDER — ROSUVASTATIN CALCIUM 20 MG PO TABS
20.0000 mg | ORAL_TABLET | Freq: Every day | ORAL | 2 refills | Status: DC
  Filled 2020-10-07: qty 90, 90d supply, fill #0
  Filled 2021-01-03: qty 90, 90d supply, fill #1
  Filled 2021-04-15: qty 90, 90d supply, fill #2

## 2020-10-19 ENCOUNTER — Other Ambulatory Visit (HOSPITAL_COMMUNITY): Payer: Self-pay

## 2020-10-21 ENCOUNTER — Other Ambulatory Visit: Payer: Self-pay | Admitting: Internal Medicine

## 2020-10-21 MED ORDER — MELOXICAM 15 MG PO TABS
15.0000 mg | ORAL_TABLET | Freq: Every day | ORAL | 1 refills | Status: DC | PRN
  Filled 2020-10-21: qty 30, 30d supply, fill #0
  Filled 2020-11-20: qty 30, 30d supply, fill #1

## 2020-10-22 ENCOUNTER — Other Ambulatory Visit (HOSPITAL_COMMUNITY): Payer: Self-pay

## 2020-11-09 ENCOUNTER — Other Ambulatory Visit (HOSPITAL_COMMUNITY): Payer: Self-pay

## 2020-11-20 ENCOUNTER — Other Ambulatory Visit (HOSPITAL_COMMUNITY): Payer: Self-pay

## 2020-11-23 ENCOUNTER — Encounter: Payer: 59 | Admitting: Internal Medicine

## 2020-12-01 ENCOUNTER — Encounter: Payer: Self-pay | Admitting: Internal Medicine

## 2020-12-01 ENCOUNTER — Ambulatory Visit (INDEPENDENT_AMBULATORY_CARE_PROVIDER_SITE_OTHER): Payer: 59 | Admitting: Internal Medicine

## 2020-12-01 ENCOUNTER — Other Ambulatory Visit: Payer: Self-pay

## 2020-12-01 ENCOUNTER — Other Ambulatory Visit (HOSPITAL_COMMUNITY): Payer: Self-pay

## 2020-12-01 VITALS — BP 138/78 | HR 88 | Temp 99.2°F | Ht 65.0 in | Wt 158.2 lb

## 2020-12-01 DIAGNOSIS — I10 Essential (primary) hypertension: Secondary | ICD-10-CM

## 2020-12-01 DIAGNOSIS — R195 Other fecal abnormalities: Secondary | ICD-10-CM | POA: Diagnosis not present

## 2020-12-01 DIAGNOSIS — E1165 Type 2 diabetes mellitus with hyperglycemia: Secondary | ICD-10-CM | POA: Diagnosis not present

## 2020-12-01 DIAGNOSIS — Z0000 Encounter for general adult medical examination without abnormal findings: Secondary | ICD-10-CM

## 2020-12-01 DIAGNOSIS — Z86010 Personal history of colonic polyps: Secondary | ICD-10-CM | POA: Diagnosis not present

## 2020-12-01 DIAGNOSIS — E7800 Pure hypercholesterolemia, unspecified: Secondary | ICD-10-CM | POA: Insufficient documentation

## 2020-12-01 DIAGNOSIS — B353 Tinea pedis: Secondary | ICD-10-CM

## 2020-12-01 LAB — POCT URINALYSIS DIPSTICK
Bilirubin, UA: NEGATIVE
Glucose, UA: NEGATIVE
Ketones, UA: NEGATIVE
Leukocytes, UA: NEGATIVE
Nitrite, UA: NEGATIVE
Protein, UA: NEGATIVE
Spec Grav, UA: 1.015 (ref 1.010–1.025)
Urobilinogen, UA: 0.2 E.U./dL
pH, UA: 7 (ref 5.0–8.0)

## 2020-12-01 LAB — POCT UA - MICROALBUMIN
Albumin/Creatinine Ratio, Urine, POC: <30
Creatinine, POC: 50 mg/dL
Microalbumin Ur, POC: 10 mg/L

## 2020-12-01 LAB — POC HEMOCCULT BLD/STL (OFFICE/1-CARD/DIAGNOSTIC): Fecal Occult Blood, POC: POSITIVE — AB

## 2020-12-01 NOTE — Progress Notes (Signed)
Rich Brave Llittleton,acting as a Education administrator for [name], MD.,have documented all relevant documentation on the behalf of [name], MD,as directed by  [name], MD while in the presence of [name], MD.  This visit occurred during the SARS-CoV-2 public health emergency.  Safety protocols were in place, including screening questions prior to the visit, additional usage of staff PPE, and extensive cleaning of exam room while observing appropriate contact time as indicated for disinfecting solutions.  Subjective:     Patient ID: Phillip Armstrong , male    DOB: July 22, 1965 , 55 y.o.   MRN: [id number]   Chief Complaint  Patient presents with   Annual Exam   Diabetes   Hypertension    HPI  He is here today for a full physical examination. He has no specific concerns or complaints at this time. He reports compliance with meds. He denies having any headaches, chest pain and shortness of breath. He states his morning blood sugars range from 80s-130s. He acknowledges that elevated sugars occur when he has "had something I shouldn't have".   Diabetes He presents for his follow-up diabetic visit. He has type 2 diabetes mellitus. There are no hypoglycemic associated symptoms. There are no diabetic associated symptoms. Pertinent negatives for diabetes include no blurred vision and no chest pain. There are no diabetic complications. An ACE inhibitor/angiotensin II receptor blocker is being taken. Eye exam is current.  Hypertension This is a chronic problem. The current episode started yesterday. The problem has been gradually improving since onset. The problem is controlled. Pertinent negatives include no blurred vision, chest pain, palpitations or shortness of breath. Risk factors for coronary artery disease include dyslipidemia and diabetes mellitus. Past treatments include angiotensin blockers and diuretics. The current treatment provides moderate improvement.    Past Medical  History:  Diagnosis Date   Diabetes mellitus 2000   T2DM   Hyperlipidemia    Hypertension      Family History  Problem Relation Age of Onset   Diabetes Son        T1DM   Hypertension Other    Hypertension Father      Current Outpatient Medications:    amLODipine (NORVASC) 10 MG tablet, Take 1 tablet (10 mg total) by mouth daily., Disp: 90 tablet, Rfl: 1   aspirin 81 MG tablet, Take 81 mg by mouth daily., Disp: , Rfl:    Blood Glucose Monitoring Suppl (FREESTYLE LITE) DEVI, Use as directed to check blood sugars 2 times per day dx: e11.65, Disp: 1 each, Rfl: 1   glucose blood (FREESTYLE LITE) test strip, Use as instructed to check blood sugars 2 times daily., Disp: 150 each, Rfl: 2   Insulin Pen Needle 32G X 4 MM MISC, USE AS DIRECTED, Disp: 100 each, Rfl: 11   Lancets (FREESTYLE) lancets, Use as instructed to check blood sugars 2 times daily., Disp: 150 each, Rfl: 2   meloxicam (MOBIC) 15 MG tablet, Take 1 tablet (15 mg total) by mouth daily as needed for knee pain., Disp: 30 tablet, Rfl: 1   metFORMIN (GLUCOPHAGE) 1000 MG tablet, Take 1 tablet (1,000 mg total) by mouth 2 (two) times daily with a meal., Disp: 180 tablet, Rfl: 1   Multiple Vitamins-Minerals (MULTIVITAMIN WITH MINERALS) tablet, Take 1 tablet by mouth daily., Disp: , Rfl:    nystatin powder, APPLY TO THE AFFECTED AREA(S) TWICE DAILY AS DIRECTED, Disp: 60 g, Rfl: 2   olmesartan-hydrochlorothiazide (BENICAR HCT) 40-25 MG tablet, Take 1 tablet  by mouth daily., Disp: 90 tablet, Rfl: 2   rosuvastatin (CRESTOR) 20 MG tablet, Take 1 tablet (20 mg total) by mouth daily., Disp: 90 tablet, Rfl: 2   Semaglutide, 1 MG/DOSE, 4 MG/3ML SOPN, Inject 1 mg into the skin once a week., Disp: 9 mL, Rfl: 2   No Known Allergies   Men's preventive visit. Patient Health Questionnaire (PHQ-2) is  Flowsheet Row Office Visit from 12/01/2020 in [facility]  PHQ-2 Total Score 0     . Patient is on a diabetic diet. Marital  status: Married. Relevant history for alcohol use is:  Social History   Substance and Sexual Activity  Alcohol Use No  . Relevant history for tobacco use is:  Social History   Tobacco Use  Smoking Status Never  Smokeless Tobacco Never  .   Review of Systems  Constitutional: Negative.   HENT: Negative.    Eyes: Negative.  Negative for blurred vision.  Respiratory: Negative.  Negative for shortness of breath.   Cardiovascular: Negative.  Negative for chest pain and palpitations.  Gastrointestinal: Negative.   Endocrine: Negative.   Genitourinary: Negative.   Musculoskeletal: Negative.   Skin: Negative.   Allergic/Immunologic: Negative.   Neurological: Negative.   Hematological: Negative.   Psychiatric/Behavioral: Negative.      Today's Vitals   12/01/20 0859  BP: 138/78  Pulse: 88  Temp: 99.2 F (37.3 C)  TempSrc: Oral  Weight: 158 lb 3.2 oz (71.8 kg)  Height: _0  (1.651 m)  PainSc: 0-No pain   Body mass index is 26.33 kg/m.  Wt Readings from Last 3 Encounters:  12/01/20 158 lb 3.2 oz (71.8 kg)  09/07/20 157 lb 9.6 oz (71.5 kg)  05/19/20 157 lb 12.8 oz (71.6 kg)     Objective:  Physical Exam Vitals and nursing note reviewed.  Constitutional:      Appearance: Normal appearance.  HENT:     Head: Normocephalic and atraumatic.     Right Ear: Tympanic membrane, ear canal and external ear normal.     Left Ear: Tympanic membrane, ear canal and external ear normal.     Nose:     Comments: Masked     Mouth/Throat:     Comments: Masked  Eyes:     Extraocular Movements: Extraocular movements intact.     Conjunctiva/sclera: Conjunctivae normal.     Pupils: Pupils are equal, round, and reactive to light.  Cardiovascular:     Rate and Rhythm: Normal rate and regular rhythm.     Pulses: Normal pulses.          Dorsalis pedis pulses are 2+ on the right side and 2+ on the left side.     Heart sounds: Normal heart sounds.  Pulmonary:     Effort: Pulmonary effort  is normal.     Breath sounds: Normal breath sounds.  Chest:  Breasts:    Right: Normal. No swelling, bleeding, inverted nipple, mass or nipple discharge.     Left: Normal. No swelling, bleeding, inverted nipple, mass or nipple discharge.  Abdominal:     General: Abdomen is flat. Bowel sounds are normal.     Palpations: Abdomen is soft.  Genitourinary:    Prostate: Normal.     Rectum: Normal. Guaiac result positive.  Musculoskeletal:        General: Normal range of motion.     Cervical back: Normal range of motion and neck supple.  Feet:     Right foot:  Protective Sensation: 5 sites tested.  5 sites sensed.     Skin integrity: Dry skin present.     Toenail Condition: Right toenails are normal.     Left foot:     Protective Sensation: 5 sites tested.  5 sites sensed.     Skin integrity: Dry skin present.     Toenail Condition: Left toenails are normal.     Comments: Scaly rash in between 4th/5th toes b/l Skin:    General: Skin is warm.  Neurological:     General: No focal deficit present.     Mental Status: He is alert.  Psychiatric:        Mood and Affect: Mood normal.        Behavior: Behavior normal.        Assessment And Plan:    1. Encounter for general adult medical examination w/o abnormal findings Comments: A full exam was performed. DRE performed, stool heme positive. He states he already has upcoming GI appt for CRC screening.  PATIENT IS ADVISED TO GET 30-45 MINUTES REGULAR EXERCISE NO LESS THAN FOUR TO FIVE DAYS PER WEEK - BOTH WEIGHTBEARING EXERCISES AND AEROBIC ARE RECOMMENDED.  PATIENT IS ADVISED TO FOLLOW A HEALTHY DIET WITH AT LEAST SIX FRUITS/VEGGIES PER DAY, DECREASE INTAKE OF RED MEAT, AND TO INCREASE FISH INTAKE TO TWO DAYS PER WEEK.  MEATS/FISH SHOULD NOT BE FRIED, BAKED OR BROILED IS PREFERABLE.  IT IS ALSO IMPORTANT TO CUT BACK ON YOUR SUGAR INTAKE. PLEASE AVOID ANYTHING WITH ADDED SUGAR, CORN SYRUP OR OTHER SWEETENERS. IF YOU MUST USE A SWEETENER, YOU  CAN TRY STEVIA. IT IS ALSO IMPORTANT TO AVOID ARTIFICIALLY SWEETENERS AND DIET BEVERAGES. LASTLY, I SUGGEST WEARING SPF 50 SUNSCREEN ON EXPOSED PARTS AND ESPECIALLY WHEN IN THE DIRECT SUNLIGHT FOR AN EXTENDED PERIOD OF TIME.  PLEASE AVOID FAST FOOD RESTAURANTS AND INCREASE YOUR WATER INTAKE.  - CMP14+EGFR - CBC no Diff - PSA - Hemoglobin A1c - POC Hemoccult Bld/Stl (1-Cd Office Dx)  2. Uncontrolled type 2 diabetes mellitus with hyperglycemia ([place]) Comments: Diabetic foot exam was performed. He will rto in 3 months for re-evaluation. I DISCUSSED WITH THE PATIENT AT LENGTH REGARDING THE GOALS OF GLYCEMIC CONTROL AND POSSIBLE LONG-TERM COMPLICATIONS.  I  ALSO STRESSED THE IMPORTANCE OF COMPLIANCE WITH HOME GLUCOSE MONITORING, DIETARY RESTRICTIONS INCLUDING AVOIDANCE OF SUGARY DRINKS/PROCESSED FOODS,  ALONG WITH REGULAR EXERCISE.  I  ALSO STRESSED THE IMPORTANCE OF ANNUAL EYE EXAMS, SELF FOOT CARE AND COMPLIANCE WITH OFFICE VISITS.  - POCT Urinalysis Dipstick (81002) - POCT UA - Microalbumin  3. Essential hypertension Comments: Chronic, fair control. Goal BP <130/80. Encouraged to follow low sodium diet. EKG performed, NSR w/o acute changes. He will f/u in 3-4 months for re-evaluation.  - EKG 12-Lead  4. Heme positive stool Comments: I will check CBC/iron panel. He denies seeing blood in his stools.  - Iron, TIBC and Ferritin Panel  5. Tinea pedis of both feet Comments: I will send in rx nystatin cream to apply to affected areas between 4th/5th toes bilaterally.   6. Personal history of colonic polyps Comments: He reports having an appt with GI on 12/16/20 to prepare for upcoming colonoscopy.   Patient was given opportunity to ask questions. Patient verbalized understanding of the plan and was able to repeat key elements of the plan. All questions were answered to their satisfaction.   I, [name], MD, have reviewed all documentation for this visit. The documentation on 12/01/20 for  the exam,  diagnosis, procedures, and orders are all accurate and complete.   THE PATIENT IS ENCOURAGED TO PRACTICE SOCIAL DISTANCING DUE TO THE COVID-19 PANDEMIC.

## 2020-12-01 NOTE — Patient Instructions (Signed)

## 2020-12-02 LAB — CMP14+EGFR
ALT: 20 IU/L (ref 0–44)
AST: 20 IU/L (ref 0–40)
Albumin/Globulin Ratio: 2 (ref 1.2–2.2)
Albumin: 5.1 g/dL — ABNORMAL HIGH (ref 3.8–4.9)
Alkaline Phosphatase: 88 IU/L (ref 44–121)
BUN/Creatinine Ratio: 18 (ref 9–20)
BUN: 14 mg/dL (ref 6–24)
Bilirubin Total: 0.7 mg/dL (ref 0.0–1.2)
CO2: 28 mmol/L (ref 20–29)
Calcium: 10.1 mg/dL (ref 8.7–10.2)
Chloride: 97 mmol/L (ref 96–106)
Creatinine, Ser: 0.76 mg/dL (ref 0.76–1.27)
Globulin, Total: 2.6 g/dL (ref 1.5–4.5)
Glucose: 152 mg/dL — ABNORMAL HIGH (ref 70–99)
Potassium: 3.9 mmol/L (ref 3.5–5.2)
Sodium: 139 mmol/L (ref 134–144)
Total Protein: 7.7 g/dL (ref 6.0–8.5)
eGFR: 106 mL/min/{1.73_m2} (ref >59)

## 2020-12-02 LAB — IRON,TIBC AND FERRITIN PANEL
Ferritin: 31 ng/mL (ref 30–400)
Iron Saturation: 18 % (ref 15–55)
Iron: 66 ug/dL (ref 38–169)
Total Iron Binding Capacity: 370 ug/dL (ref 250–450)
UIBC: 304 ug/dL (ref 111–343)

## 2020-12-02 LAB — CBC
Hematocrit: 43.5 % (ref 37.5–51.0)
Hemoglobin: 14.8 g/dL (ref 13.0–17.7)
MCH: 27.1 pg (ref 26.6–33.0)
MCHC: 34 g/dL (ref 31.5–35.7)
MCV: 80 fL (ref 79–97)
Platelets: 298 10*3/uL (ref 150–450)
RBC: 5.46 x10E6/uL (ref 4.14–5.80)
RDW: 12.1 % (ref 11.6–15.4)
WBC: 5.1 10*3/uL (ref 3.4–10.8)

## 2020-12-02 LAB — PSA: Prostate Specific Ag, Serum: 0.3 ng/mL (ref 0.0–4.0)

## 2020-12-02 LAB — HEMOGLOBIN A1C
Est. average glucose Bld gHb Est-mCnc: 177 mg/dL
Hgb A1c MFr Bld: 7.8 % — ABNORMAL HIGH (ref 4.8–5.6)

## 2020-12-03 ENCOUNTER — Other Ambulatory Visit (HOSPITAL_COMMUNITY): Payer: Self-pay

## 2020-12-04 ENCOUNTER — Encounter: Payer: Self-pay | Admitting: Internal Medicine

## 2020-12-16 ENCOUNTER — Encounter: Payer: Self-pay | Admitting: Internal Medicine

## 2020-12-16 DIAGNOSIS — E1165 Type 2 diabetes mellitus with hyperglycemia: Secondary | ICD-10-CM | POA: Diagnosis not present

## 2020-12-16 DIAGNOSIS — I10 Essential (primary) hypertension: Secondary | ICD-10-CM | POA: Diagnosis not present

## 2020-12-16 DIAGNOSIS — Z1211 Encounter for screening for malignant neoplasm of colon: Secondary | ICD-10-CM | POA: Diagnosis not present

## 2020-12-17 ENCOUNTER — Other Ambulatory Visit: Payer: Self-pay | Admitting: Internal Medicine

## 2020-12-17 ENCOUNTER — Other Ambulatory Visit (HOSPITAL_COMMUNITY): Payer: Self-pay

## 2020-12-17 DIAGNOSIS — I10 Essential (primary) hypertension: Secondary | ICD-10-CM

## 2020-12-17 MED ORDER — AMLODIPINE BESYLATE 10 MG PO TABS
10.0000 mg | ORAL_TABLET | Freq: Every day | ORAL | 1 refills | Status: DC
  Filled 2020-12-17: qty 90, 90d supply, fill #0
  Filled 2021-03-25: qty 90, 90d supply, fill #1

## 2020-12-18 ENCOUNTER — Other Ambulatory Visit (HOSPITAL_COMMUNITY): Payer: Self-pay

## 2020-12-18 MED ORDER — CLENPIQ 10-3.5-12 MG-GM -GM/160ML PO SOLN
160.0000 mL | Freq: Two times a day (BID) | ORAL | 0 refills | Status: DC
  Filled 2020-12-18: qty 320, 1d supply, fill #0

## 2020-12-25 ENCOUNTER — Other Ambulatory Visit: Payer: Self-pay | Admitting: Internal Medicine

## 2020-12-25 ENCOUNTER — Other Ambulatory Visit (HOSPITAL_COMMUNITY): Payer: Self-pay

## 2020-12-25 MED ORDER — MELOXICAM 15 MG PO TABS
15.0000 mg | ORAL_TABLET | Freq: Every day | ORAL | 1 refills | Status: DC | PRN
  Filled 2020-12-25: qty 30, 30d supply, fill #0
  Filled 2021-01-28: qty 30, 30d supply, fill #1

## 2020-12-31 ENCOUNTER — Encounter: Payer: Self-pay | Admitting: Internal Medicine

## 2020-12-31 DIAGNOSIS — K635 Polyp of colon: Secondary | ICD-10-CM | POA: Diagnosis not present

## 2020-12-31 DIAGNOSIS — Z1211 Encounter for screening for malignant neoplasm of colon: Secondary | ICD-10-CM | POA: Diagnosis not present

## 2020-12-31 LAB — HM COLONOSCOPY

## 2021-01-04 ENCOUNTER — Other Ambulatory Visit (HOSPITAL_COMMUNITY): Payer: Self-pay

## 2021-01-05 ENCOUNTER — Other Ambulatory Visit: Payer: 59

## 2021-01-18 ENCOUNTER — Other Ambulatory Visit (HOSPITAL_COMMUNITY): Payer: Self-pay

## 2021-01-28 ENCOUNTER — Other Ambulatory Visit (HOSPITAL_COMMUNITY): Payer: Self-pay

## 2021-02-26 ENCOUNTER — Other Ambulatory Visit (HOSPITAL_COMMUNITY): Payer: Self-pay

## 2021-02-26 ENCOUNTER — Other Ambulatory Visit: Payer: Self-pay | Admitting: Internal Medicine

## 2021-02-26 MED ORDER — MELOXICAM 15 MG PO TABS
15.0000 mg | ORAL_TABLET | Freq: Every day | ORAL | 1 refills | Status: DC | PRN
Start: 2021-02-26 — End: 2021-04-29
  Filled 2021-02-26: qty 30, 30d supply, fill #0
  Filled 2021-03-30: qty 30, 30d supply, fill #1

## 2021-03-25 ENCOUNTER — Encounter: Payer: Self-pay | Admitting: Internal Medicine

## 2021-03-25 ENCOUNTER — Ambulatory Visit: Payer: 59 | Admitting: Internal Medicine

## 2021-03-25 ENCOUNTER — Other Ambulatory Visit: Payer: Self-pay | Admitting: Internal Medicine

## 2021-03-25 ENCOUNTER — Other Ambulatory Visit (HOSPITAL_COMMUNITY): Payer: Self-pay

## 2021-03-25 ENCOUNTER — Other Ambulatory Visit: Payer: Self-pay

## 2021-03-25 VITALS — BP 120/79 | HR 59 | Temp 97.9°F | Ht 64.4 in | Wt 156.2 lb

## 2021-03-25 DIAGNOSIS — E7800 Pure hypercholesterolemia, unspecified: Secondary | ICD-10-CM | POA: Diagnosis not present

## 2021-03-25 DIAGNOSIS — E1165 Type 2 diabetes mellitus with hyperglycemia: Secondary | ICD-10-CM

## 2021-03-25 DIAGNOSIS — I10 Essential (primary) hypertension: Secondary | ICD-10-CM

## 2021-03-25 DIAGNOSIS — Z6826 Body mass index (BMI) 26.0-26.9, adult: Secondary | ICD-10-CM | POA: Diagnosis not present

## 2021-03-25 NOTE — Progress Notes (Signed)
?Rich Brave Llittleton,acting as a Education administrator for [name], MD.,have documented all relevant documentation on the behalf of [name], MD,as directed by  [name], MD while in the presence of [name], MD.  ?This visit occurred during the SARS-CoV-2 public health emergency.  Safety protocols were in place, including screening questions prior to the visit, additional usage of staff PPE, and extensive cleaning of exam room while observing appropriate contact time as indicated for disinfecting solutions. ? ?Subjective:  ?  ? Patient ID: Phillip Armstrong , male    DOB: [birth year] , 56 y.o.   MRN: [id number] ? ? ?Chief Complaint  ?Patient presents with  ? Diabetes  ? Hypertension  ? ? ?HPI ? ?He is here today for HTN/DM check. He is concerned about elevated blood sugars. Wants to know if he can try glipizide. He did not tolerate Xigduo and does not wish to try lower dose of [place]. He reports compliance with meds.  ? ?Diabetes ?He presents for his follow-up diabetic visit. He has type 2 diabetes mellitus. There are no hypoglycemic associated symptoms. There are no diabetic associated symptoms. Pertinent negatives for diabetes include no blurred vision, no chest pain, no polydipsia, no polyphagia and no polyuria. There are no diabetic complications. An ACE inhibitor/angiotensin II receptor blocker is being taken. Eye exam is current.  ?Hypertension ?This is a chronic problem. The current episode started yesterday. The problem has been gradually improving since onset. The problem is controlled. Pertinent negatives include no blurred vision, chest pain, palpitations or shortness of breath. Risk factors for coronary artery disease include dyslipidemia and diabetes mellitus. Past treatments include angiotensin blockers and diuretics. The current treatment provides moderate improvement.   ? ?Past Medical History:  ?Diagnosis Date  ? Diabetes mellitus 2000  ? T2DM  ? Hyperlipidemia   ? Hypertension   ?   ? ?Family History  ?Problem Relation Age of Onset  ? Diabetes Son   ?     T1DM  ? Hypertension Other   ? Hypertension Father   ? ? ? ?Current Outpatient Medications:  ?  amLODipine (NORVASC) 10 MG tablet, Take 1 tablet (10 mg total) by mouth daily., Disp: 90 tablet, Rfl: 1 ?  aspirin 81 MG tablet, Take 81 mg by mouth daily., Disp: , Rfl:  ?  Blood Glucose Monitoring Suppl (FREESTYLE LITE) DEVI, Use as directed to check blood sugars 2 times per day dx: e11.65, Disp: 1 each, Rfl: 1 ?  glucose blood (FREESTYLE LITE) test strip, Use as instructed to check blood sugars 2 times daily., Disp: 150 each, Rfl: 2 ?  Insulin Pen Needle 32G X 4 MM MISC, USE AS DIRECTED, Disp: 100 each, Rfl: 11 ?  Lancets (FREESTYLE) lancets, Use as instructed to check blood sugars 2 times daily., Disp: 150 each, Rfl: 2 ?  meloxicam (MOBIC) 15 MG tablet, Take 1 tablet (15 mg total) by mouth daily as needed for knee pain., Disp: 30 tablet, Rfl: 1 ?  metFORMIN (GLUCOPHAGE) 1000 MG tablet, Take 1 tablet (1,000 mg total) by mouth 2 (two) times daily with a meal., Disp: 180 tablet, Rfl: 1 ?  Multiple Vitamins-Minerals (MULTIVITAMIN WITH MINERALS) tablet, Take 1 tablet by mouth daily., Disp: , Rfl:  ?  nystatin powder, APPLY TO THE AFFECTED AREA(S) TWICE DAILY AS DIRECTED, Disp: 60 g, Rfl: 2 ?  olmesartan-hydrochlorothiazide (BENICAR HCT) 40-25 MG tablet, Take 1 tablet by mouth daily., Disp: 90 tablet, Rfl: 2 ?  rosuvastatin (CRESTOR) 20 MG  tablet, Take 1 tablet (20 mg total) by mouth daily., Disp: 90 tablet, Rfl: 2 ?  Semaglutide, 1 MG/DOSE, (OZEMPIC, 1 MG/DOSE,) 4 MG/3ML SOPN, Inject 1 mg into the skin once a week., Disp: 9 mL, Rfl: 2 ?  Sod Picosulfate-Mag Ox-Cit Acd (CLENPIQ) 10-3.5-12 MG-GM -GM/160ML SOLN, Take 160 mLs by mouth 2 (two) times as directed. (Patient not taking: Reported on 03/25/2021), Disp: 320 mL, Rfl: 0  ? ?No Known Allergies  ? ?Review of Systems  ?Constitutional: Negative.   ?Eyes: Negative.  Negative for blurred vision.   ?Respiratory: Negative.  Negative for shortness of breath.   ?Cardiovascular: Negative.  Negative for chest pain and palpitations.  ?Gastrointestinal: Negative.   ?Endocrine: Negative for polydipsia, polyphagia and polyuria.  ?Musculoskeletal: Negative.   ?Skin: Negative.   ?Neurological: Negative.   ?Psychiatric/Behavioral: Negative.     ? ?Today's Vitals  ? 03/25/21 0844  ?BP: 120/79  ?Pulse: (!) 59  ?Temp: 97.9 ?F (36.6 ?C)  ?Weight: 156 lb 3.2 oz (70.9 kg)  ?Height: 5' 4.4" (1.636 m)  ?PainSc: 0-No pain  ? ?Body mass index is 26.48 kg/m?.  ?Wt Readings from Last 3 Encounters:  ?03/25/21 156 lb 3.2 oz (70.9 kg)  ?12/01/20 158 lb 3.2 oz (71.8 kg)  ?09/07/20 157 lb 9.6 oz (71.5 kg)  ?  ? ?Objective:  ?Physical Exam ?Vitals and nursing note reviewed.  ?Constitutional:   ?   Appearance: Normal appearance.  ?HENT:  ?   Head: Normocephalic and atraumatic.  ?   Nose:  ?   Comments: Masked  ?   Mouth/Throat:  ?   Comments: Masked  ?Eyes:  ?   Extraocular Movements: Extraocular movements intact.  ?Cardiovascular:  ?   Rate and Rhythm: Normal rate and regular rhythm.  ?   Heart sounds: Normal heart sounds.  ?Pulmonary:  ?   Effort: Pulmonary effort is normal.  ?   Breath sounds: Normal breath sounds.  ?Musculoskeletal:  ?   Cervical back: Normal range of motion.  ?Skin: ?   General: Skin is warm.  ?Neurological:  ?   General: No focal deficit present.  ?   Mental Status: He is alert.  ?Psychiatric:     ?   Mood and Affect: Mood normal.  ?  ? ?   ?Assessment And Plan:  ?   ?1. Uncontrolled type 2 diabetes mellitus with hyperglycemia ([place]) ?Comments: Chronic. Pt advised that SGLT2 is better option than sulfonylurea. He agrees to try Jardiance, but is hesitant. Due to his hesitancy, we will start with qod dosing. If tolerated, I plan to increase to M-F dosing with drug holiday on Sat/Sundays. I plan to see him back in four weeks.  ?- Hemoglobin A1c ?- Lipid panel ? ?2. Essential hypertension ?Comments: Chronic, well  controlled. No med changes are needed at this time. He will c/w olmesartan/hctz and amlodipine daily. F/u in 4-6 months.  ?- CMP14+EGFR ? ?3. Pure hypercholesterolemia ?Comments: Chronic, currently on rosuvastatin daily. Importance of dietary/medication compliance was d/w patient . ? ?4. BMI 26.0-26.9,adult ?Comments: He is encouraged to aim for at least 150 minutes of exercise per week.  ?  ?Patient was given opportunity to ask questions. Patient verbalized understanding of the plan and was able to repeat key elements of the plan. All questions were answered to their satisfaction. ? ?I, [name], MD, have reviewed all documentation for this visit. The documentation on 03/25/21 for the exam, diagnosis, procedures, and orders are all accurate and complete.  ? ?  IF YOU HAVE BEEN REFERRED TO A SPECIALIST, IT MAY TAKE 1-2 WEEKS TO SCHEDULE/PROCESS THE REFERRAL. IF YOU HAVE NOT HEARD FROM US/SPECIALIST IN TWO WEEKS, PLEASE GIVE [place] A CALL AT [phone].  ? ?THE PATIENT IS ENCOURAGED TO PRACTICE SOCIAL DISTANCING DUE TO THE COVID-19 PANDEMIC.   ?

## 2021-03-25 NOTE — Patient Instructions (Addendum)
Start Jardiance '10mg'$  ?Because of your previous experience with Merleen Nicely, you will start taking T, Th, Sat with breakfast ? ?Email Dr. Baird Cancer with update next week ? ?Diabetes Mellitus and Nutrition, Adult ?When you have diabetes, or diabetes mellitus, it is very important to have healthy eating habits because your blood sugar (glucose) levels are greatly affected by what you eat and drink. Eating healthy foods in the right amounts, at about the same times every day, can help you: ?Manage your blood glucose. ?Lower your risk of heart disease. ?Improve your blood pressure. ?Reach or maintain a healthy weight. ?What can affect my meal plan? ?Every person with diabetes is different, and each person has different needs for a meal plan. Your health care provider may recommend that you work with a dietitian to make a meal plan that is best for you. Your meal plan may vary depending on factors such as: ?The calories you need. ?The medicines you take. ?Your weight. ?Your blood glucose, blood pressure, and cholesterol levels. ?Your activity level. ?Other health conditions you have, such as heart or kidney disease. ?How do carbohydrates affect me? ?Carbohydrates, also called carbs, affect your blood glucose level more than any other type of food. Eating carbs raises the amount of glucose in your blood. ?It is important to know how many carbs you can safely have in each meal. This is different for every person. Your dietitian can help you calculate how many carbs you should have at each meal and for each snack. ?How does alcohol affect me? ?Alcohol can cause a decrease in blood glucose (hypoglycemia), especially if you use insulin or take certain diabetes medicines by mouth. Hypoglycemia can be a life-threatening condition. Symptoms of hypoglycemia, such as sleepiness, dizziness, and confusion, are similar to symptoms of having too much alcohol. ?Do not drink alcohol if: ?Your health care provider tells you not to drink. ?You  are pregnant, may be pregnant, or are planning to become pregnant. ?If you drink alcohol: ?Limit how much you have to: ?0-1 drink a day for women. ?0-2 drinks a day for men. ?Know how much alcohol is in your drink. In the [place] one drink equals one 12 oz bottle of beer (355 mL), one 5 oz glass of wine (148 mL), or one 1? oz glass of hard liquor (44 mL). ?Keep yourself hydrated with water, diet soda, or unsweetened iced tea. Keep in mind that regular soda, juice, and other mixers may contain a lot of sugar and must be counted as carbs. ?What are tips for following this plan? ?Reading food labels ?Start by checking the serving size on the Nutrition Facts label of packaged foods and drinks. The number of calories and the amount of carbs, fats, and other nutrients listed on the label are based on one serving of the item. Many items contain more than one serving per package. ?Check the total grams (g) of carbs in one serving. ?Check the number of grams of saturated fats and trans fats in one serving. Choose foods that have a low amount or none of these fats. ?Check the number of milligrams (mg) of salt (sodium) in one serving. Most people should limit total sodium intake to less than 2,300 mg per day. ?Always check the nutrition information of foods labeled as "low-fat" or "nonfat." These foods may be higher in added sugar or refined carbs and should be avoided. ?Talk to your dietitian to identify your daily goals for nutrients listed on the label. ?Shopping ?Avoid buying canned, pre-made, or  processed foods. These foods tend to be high in fat, sodium, and added sugar. ?Shop around the outside edge of the grocery store. This is where you will most often find fresh fruits and vegetables, bulk grains, fresh meats, and fresh dairy products. ?Cooking ?Use low-heat cooking methods, such as baking, instead of high-heat cooking methods, such as deep frying. ?Cook using healthy oils, such as olive, canola, or sunflower  oil. ?Avoid cooking with butter, cream, or high-fat meats. ?Meal planning ?Eat meals and snacks regularly, preferably at the same times every day. Avoid going long periods of time without eating. ?Eat foods that are high in fiber, such as fresh fruits, vegetables, beans, and whole grains. ?Eat 4-6 oz (112-168 g) of lean protein each day, such as lean meat, chicken, fish, eggs, or tofu. One ounce (oz) (28 g) of lean protein is equal to: ?1 oz (28 g) of meat, chicken, or fish. ?1 egg. ?? cup (62 g) of tofu. ?Eat some foods each day that contain healthy fats, such as avocado, nuts, seeds, and fish. ?What foods should I eat? ?Fruits ?Berries. Apples. Oranges. Peaches. Apricots. Plums. Grapes. Mangoes. Papayas. Pomegranates. Kiwi. Cherries. ?Vegetables ?Leafy greens, including lettuce, spinach, kale, chard, collard greens, mustard greens, and cabbage. Beets. Cauliflower. Broccoli. Carrots. Green beans. Tomatoes. Peppers. Onions. Cucumbers. Brussels sprouts. ?Grains ?Whole grains, such as whole-wheat or whole-grain bread, crackers, tortillas, cereal, and pasta. Unsweetened oatmeal. Quinoa. Brown or wild rice. ?Meats and other proteins ?Seafood. Poultry without skin. Lean cuts of poultry and beef. Tofu. Nuts. Seeds. ?Dairy ?Low-fat or fat-free dairy products such as milk, yogurt, and cheese. ?The items listed above may not be a complete list of foods and beverages you can eat and drink. Contact a dietitian for more information. ?What foods should I avoid? ?Fruits ?Fruits canned with syrup. ?Vegetables ?Canned vegetables. Frozen vegetables with butter or cream sauce. ?Grains ?Refined white flour and flour products such as bread, pasta, snack foods, and cereals. Avoid all processed foods. ?Meats and other proteins ?Fatty cuts of meat. Poultry with skin. Breaded or fried meats. Processed meat. Avoid saturated fats. ?Dairy ?Full-fat yogurt, cheese, or milk. ?Beverages ?Sweetened drinks, such as soda or iced tea. ?The items  listed above may not be a complete list of foods and beverages you should avoid. Contact a dietitian for more information. ?Questions to ask a health care provider ?Do I need to meet with a certified diabetes care and education specialist? ?Do I need to meet with a dietitian? ?What number can I call if I have questions? ?When are the best times to check my blood glucose? ?Where to find more information: ?American Diabetes Association: diabetes.org ?Academy of Nutrition and Dietetics: eatright.org ?[employer] of Diabetes and Digestive and Kidney Diseases: AmenCredit.is ?Association of Diabetes Care & Education Specialists: diabeteseducator.org ?Summary ?It is important to have healthy eating habits because your blood sugar (glucose) levels are greatly affected by what you eat and drink. It is important to use alcohol carefully. ?A healthy meal plan will help you manage your blood glucose and lower your risk of heart disease. ?Your health care provider may recommend that you work with a dietitian to make a meal plan that is best for you. ?This information is not intended to replace advice given to you by your health care provider. Make sure you discuss any questions you have with your health care provider. ?Document Revised: 08/07/2019 Document Reviewed: 08/07/2019 ?Elsevier Patient Education ? [place]. ?. ?

## 2021-03-26 ENCOUNTER — Other Ambulatory Visit (HOSPITAL_COMMUNITY): Payer: Self-pay

## 2021-03-26 ENCOUNTER — Other Ambulatory Visit: Payer: Self-pay | Admitting: Internal Medicine

## 2021-03-26 LAB — LIPID PANEL
Chol/HDL Ratio: 3 ratio (ref 0.0–5.0)
Cholesterol, Total: 137 mg/dL (ref 100–199)
HDL: 46 mg/dL (ref >39)
LDL Chol Calc (NIH): 76 mg/dL (ref 0–99)
Triglycerides: 76 mg/dL (ref 0–149)
VLDL Cholesterol Cal: 15 mg/dL (ref 5–40)

## 2021-03-26 LAB — CMP14+EGFR
ALT: 19 IU/L (ref 0–44)
AST: 22 IU/L (ref 0–40)
Albumin/Globulin Ratio: 2.2 (ref 1.2–2.2)
Albumin: 5.1 g/dL — ABNORMAL HIGH (ref 3.8–4.9)
Alkaline Phosphatase: 98 IU/L (ref 44–121)
BUN/Creatinine Ratio: 18 (ref 9–20)
BUN: 14 mg/dL (ref 6–24)
Bilirubin Total: 0.7 mg/dL (ref 0.0–1.2)
CO2: 25 mmol/L (ref 20–29)
Calcium: 10.3 mg/dL — ABNORMAL HIGH (ref 8.7–10.2)
Chloride: 99 mmol/L (ref 96–106)
Creatinine, Ser: 0.79 mg/dL (ref 0.76–1.27)
Globulin, Total: 2.3 g/dL (ref 1.5–4.5)
Glucose: 145 mg/dL — ABNORMAL HIGH (ref 70–99)
Potassium: 3.9 mmol/L (ref 3.5–5.2)
Sodium: 142 mmol/L (ref 134–144)
Total Protein: 7.4 g/dL (ref 6.0–8.5)
eGFR: 105 mL/min/{1.73_m2} (ref >59)

## 2021-03-26 LAB — HEMOGLOBIN A1C
Est. average glucose Bld gHb Est-mCnc: 177 mg/dL
Hgb A1c MFr Bld: 7.8 % — ABNORMAL HIGH (ref 4.8–5.6)

## 2021-03-26 MED ORDER — OZEMPIC (1 MG/DOSE) 4 MG/3ML ~~LOC~~ SOPN
1.0000 mg | PEN_INJECTOR | SUBCUTANEOUS | 2 refills | Status: DC
  Filled 2021-03-26: qty 9, 84d supply, fill #0
  Filled 2021-06-28: qty 9, 84d supply, fill #1
  Filled 2021-09-21: qty 9, 84d supply, fill #2

## 2021-03-30 ENCOUNTER — Telehealth: Payer: Self-pay

## 2021-03-30 ENCOUNTER — Other Ambulatory Visit (HOSPITAL_COMMUNITY): Payer: Self-pay

## 2021-03-30 NOTE — Telephone Encounter (Signed)
The patient states he is doing good, his sugars has been running around 99 when asked how he is doing on the jardiance.  ?

## 2021-04-01 ENCOUNTER — Other Ambulatory Visit: Payer: Self-pay | Admitting: Internal Medicine

## 2021-04-01 ENCOUNTER — Other Ambulatory Visit (HOSPITAL_COMMUNITY): Payer: Self-pay

## 2021-04-02 ENCOUNTER — Other Ambulatory Visit (HOSPITAL_COMMUNITY): Payer: Self-pay

## 2021-04-02 MED ORDER — METFORMIN HCL 1000 MG PO TABS
1000.0000 mg | ORAL_TABLET | Freq: Two times a day (BID) | ORAL | 1 refills | Status: DC
  Filled 2021-04-02: qty 180, 90d supply, fill #0
  Filled 2021-07-08: qty 180, 90d supply, fill #1

## 2021-04-16 ENCOUNTER — Other Ambulatory Visit (HOSPITAL_COMMUNITY): Payer: Self-pay

## 2021-04-28 ENCOUNTER — Other Ambulatory Visit (HOSPITAL_COMMUNITY): Payer: Self-pay

## 2021-04-28 ENCOUNTER — Encounter: Payer: Self-pay | Admitting: Internal Medicine

## 2021-04-28 ENCOUNTER — Ambulatory Visit: Payer: 59 | Admitting: Internal Medicine

## 2021-04-28 VITALS — BP 124/80 | HR 98 | Temp 98.1°F | Ht 64.4 in | Wt 160.2 lb

## 2021-04-28 DIAGNOSIS — E1165 Type 2 diabetes mellitus with hyperglycemia: Secondary | ICD-10-CM | POA: Diagnosis not present

## 2021-04-28 DIAGNOSIS — Z6827 Body mass index (BMI) 27.0-27.9, adult: Secondary | ICD-10-CM | POA: Diagnosis not present

## 2021-04-28 MED ORDER — PIOGLITAZONE HCL 15 MG PO TABS
15.0000 mg | ORAL_TABLET | Freq: Every day | ORAL | 2 refills | Status: DC
  Filled 2021-04-28: qty 90, 90d supply, fill #0
  Filled 2021-07-23: qty 90, 90d supply, fill #1
  Filled 2021-10-15: qty 90, 90d supply, fill #2

## 2021-04-28 NOTE — Progress Notes (Signed)
?Rich Brave Llittleton,acting as a Education administrator for [name], MD.,have documented all relevant documentation on the behalf of [name], MD,as directed by  [name], MD while in the presence of [name], MD.  ?This visit occurred during the SARS-CoV-2 public health emergency.  Safety protocols were in place, including screening questions prior to the visit, additional usage of staff PPE, and extensive cleaning of exam room while observing appropriate contact time as indicated for disinfecting solutions. ? ?Subjective:  ?  ? Patient ID: Phillip Armstrong , male    DOB: 05-26-1965 , 56 y.o.   MRN: [id number] ? ? ?Chief Complaint  ?Patient presents with  ? Diabetes  ? ? ?HPI ? ?He is here today for DM f/u. He was started on Jardiance at his last visit. He was unable to tolerate the medication with daily dosing. He then started to take once weekly, he states urinary frequency is better with this dosing schedule. He does not wish to continue with this medication. He prefers to return to Actosplusmet, states his sugars were better controlled with this medication.  ? ?Diabetes ?He presents for his follow-up diabetic visit. He has type 2 diabetes mellitus. There are no hypoglycemic associated symptoms. There are no diabetic associated symptoms. Pertinent negatives for diabetes include no polydipsia, no polyphagia and no polyuria. There are no diabetic complications. An ACE inhibitor/angiotensin II receptor blocker is being taken. Eye exam is current.   ? ?Past Medical History:  ?Diagnosis Date  ? Diabetes mellitus 2000  ? T2DM  ? Hyperlipidemia   ? Hypertension   ?  ? ?Family History  ?Problem Relation Age of Onset  ? Diabetes Son   ?     T1DM  ? Hypertension Other   ? Hypertension Father   ? ? ? ?Current Outpatient Medications:  ?  amLODipine (NORVASC) 10 MG tablet, Take 1 tablet (10 mg total) by mouth daily., Disp: 90 tablet, Rfl: 1 ?  aspirin 81 MG tablet, Take 81 mg by mouth daily., Disp: , Rfl:  ?   Blood Glucose Monitoring Suppl (FREESTYLE LITE) DEVI, Use as directed to check blood sugars 2 times per day dx: e11.65, Disp: 1 each, Rfl: 1 ?  glucose blood (FREESTYLE LITE) test strip, Use as instructed to check blood sugars 2 times daily., Disp: 150 each, Rfl: 2 ?  Lancets (FREESTYLE) lancets, Use as instructed to check blood sugars 2 times daily., Disp: 150 each, Rfl: 2 ?  meloxicam (MOBIC) 15 MG tablet, Take 1 tablet (15 mg total) by mouth daily as needed for knee pain., Disp: 30 tablet, Rfl: 1 ?  metFORMIN (GLUCOPHAGE) 1000 MG tablet, Take 1 tablet (1,000 mg total) by mouth 2 (two) times daily with a meal., Disp: 180 tablet, Rfl: 1 ?  Multiple Vitamins-Minerals (MULTIVITAMIN WITH MINERALS) tablet, Take 1 tablet by mouth daily., Disp: , Rfl:  ?  nystatin powder, APPLY TO THE AFFECTED AREA(S) TWICE DAILY AS DIRECTED, Disp: 60 g, Rfl: 2 ?  olmesartan-hydrochlorothiazide (BENICAR HCT) 40-25 MG tablet, Take 1 tablet by mouth daily., Disp: 90 tablet, Rfl: 2 ?  pioglitazone (ACTOS) 15 MG tablet, Take 1 tablet (15 mg total) by mouth daily., Disp: 90 tablet, Rfl: 2 ?  rosuvastatin (CRESTOR) 20 MG tablet, Take 1 tablet (20 mg total) by mouth daily., Disp: 90 tablet, Rfl: 2 ?  Semaglutide, 1 MG/DOSE, (OZEMPIC, 1 MG/DOSE,) 4 MG/3ML SOPN, Inject 1 mg into the skin once a week., Disp: 9 mL, Rfl: 2 ?  Sod Picosulfate-Mag Ox-Cit Acd (CLENPIQ) 10-3.5-12 MG-GM -GM/160ML SOLN, Take 160 mLs by mouth 2 (two) times as directed. (Patient not taking: Reported on 03/25/2021), Disp: 320 mL, Rfl: 0  ? ?No Known Allergies  ? ?Review of Systems  ?Constitutional: Negative.   ?Respiratory: Negative.    ?Cardiovascular: Negative.   ?Gastrointestinal: Negative.   ?Endocrine: Negative for polydipsia, polyphagia and polyuria.  ?Genitourinary:  Positive for frequency.  ?Neurological: Negative.   ?Psychiatric/Behavioral: Negative.     ? ?Today's Vitals  ? 04/28/21 1451  ?BP: 124/80  ?Pulse: 98  ?Temp: 98.1 ?F (36.7 ?C)  ?Weight: 160 lb 3.2 oz (72.7  kg)  ?Height: 5' 4.4" (1.636 m)  ?PainSc: 0-No pain  ? ?Body mass index is 27.16 kg/m?.  ?Wt Readings from Last 3 Encounters:  ?04/28/21 160 lb 3.2 oz (72.7 kg)  ?03/25/21 156 lb 3.2 oz (70.9 kg)  ?12/01/20 158 lb 3.2 oz (71.8 kg)  ?  ? ?Objective:  ?Physical Exam ?Vitals and nursing note reviewed.  ?Constitutional:   ?   Appearance: Normal appearance.  ?HENT:  ?   Head: Normocephalic and atraumatic.  ?Eyes:  ?   Extraocular Movements: Extraocular movements intact.  ?Cardiovascular:  ?   Rate and Rhythm: Normal rate and regular rhythm.  ?   Heart sounds: Normal heart sounds.  ?Pulmonary:  ?   Effort: Pulmonary effort is normal.  ?   Breath sounds: Normal breath sounds.  ?Musculoskeletal:  ?   Cervical back: Normal range of motion.  ?Skin: ?   General: Skin is warm.  ?Neurological:  ?   General: No focal deficit present.  ?   Mental Status: He is alert.  ?Psychiatric:     ?   Mood and Affect: Mood normal.  ?  ? ?   ?Assessment And Plan:  ?   ?1. Uncontrolled type 2 diabetes mellitus with hyperglycemia ([place]) ?Comments: Unfortunately, he is intolerant of [place] and [place]. He declines Endo referral. I will resume pioglitazone 15mg  per his request. He will f/u 6/23.  ?- BMP8+EGFR ? ?2. BMI 27.0-27.9,adult ?Comments: He is encouraged to aim for at least 150 minutes of exercise per week.  ?  ?Patient was given opportunity to ask questions. Patient verbalized understanding of the plan and was able to repeat key elements of the plan. All questions were answered to their satisfaction.  ? ?I, [name], MD, have reviewed all documentation for this visit. The documentation on 04/28/21 for the exam, diagnosis, procedures, and orders are all accurate and complete.  ? ?IF YOU HAVE BEEN REFERRED TO A SPECIALIST, IT MAY TAKE 1-2 WEEKS TO SCHEDULE/PROCESS THE REFERRAL. IF YOU HAVE NOT HEARD FROM US/SPECIALIST IN TWO WEEKS, PLEASE GIVE [place] A CALL AT [phone] X [phone].  ? ?THE PATIENT IS ENCOURAGED TO PRACTICE SOCIAL  DISTANCING DUE TO THE COVID-19 PANDEMIC.   ?

## 2021-04-28 NOTE — Patient Instructions (Signed)

## 2021-04-29 ENCOUNTER — Other Ambulatory Visit (HOSPITAL_COMMUNITY): Payer: Self-pay

## 2021-04-29 ENCOUNTER — Other Ambulatory Visit: Payer: Self-pay | Admitting: Internal Medicine

## 2021-04-29 LAB — BMP8+EGFR
BUN/Creatinine Ratio: 20 (ref 9–20)
BUN: 16 mg/dL (ref 6–24)
CO2: 24 mmol/L (ref 20–29)
Calcium: 10.4 mg/dL — ABNORMAL HIGH (ref 8.7–10.2)
Chloride: 96 mmol/L (ref 96–106)
Creatinine, Ser: 0.81 mg/dL (ref 0.76–1.27)
Glucose: 199 mg/dL — ABNORMAL HIGH (ref 70–99)
Potassium: 3.8 mmol/L (ref 3.5–5.2)
Sodium: 140 mmol/L (ref 134–144)
eGFR: 104 mL/min/{1.73_m2} (ref >59)

## 2021-04-29 MED ORDER — MELOXICAM 15 MG PO TABS
15.0000 mg | ORAL_TABLET | Freq: Every day | ORAL | 1 refills | Status: DC | PRN
  Filled 2021-04-29: qty 30, 30d supply, fill #0
  Filled 2021-05-27: qty 30, 30d supply, fill #1

## 2021-05-14 ENCOUNTER — Other Ambulatory Visit (HOSPITAL_COMMUNITY): Payer: Self-pay

## 2021-05-28 ENCOUNTER — Other Ambulatory Visit (HOSPITAL_COMMUNITY): Payer: Self-pay

## 2021-06-28 ENCOUNTER — Other Ambulatory Visit: Payer: Self-pay | Admitting: Internal Medicine

## 2021-06-28 DIAGNOSIS — I10 Essential (primary) hypertension: Secondary | ICD-10-CM

## 2021-06-29 ENCOUNTER — Other Ambulatory Visit (HOSPITAL_COMMUNITY): Payer: Self-pay

## 2021-06-29 MED ORDER — AMLODIPINE BESYLATE 10 MG PO TABS
10.0000 mg | ORAL_TABLET | Freq: Every day | ORAL | 1 refills | Status: DC
  Filled 2021-06-29: qty 90, 90d supply, fill #0
  Filled 2021-09-24: qty 90, 90d supply, fill #1

## 2021-06-29 MED ORDER — MELOXICAM 15 MG PO TABS
15.0000 mg | ORAL_TABLET | Freq: Every day | ORAL | 1 refills | Status: DC | PRN
  Filled 2021-06-29: qty 30, 30d supply, fill #0
  Filled 2021-08-19: qty 30, 30d supply, fill #1

## 2021-07-09 ENCOUNTER — Other Ambulatory Visit (HOSPITAL_COMMUNITY): Payer: Self-pay

## 2021-07-11 ENCOUNTER — Other Ambulatory Visit: Payer: Self-pay | Admitting: Internal Medicine

## 2021-07-12 ENCOUNTER — Other Ambulatory Visit (HOSPITAL_COMMUNITY): Payer: Self-pay

## 2021-07-12 MED ORDER — UNIFINE PENTIPS 32G X 4 MM MISC
11 refills | Status: DC
  Filled 2021-07-12: qty 100, 90d supply, fill #0
  Filled 2021-07-17 – 2022-03-08 (×3): qty 100, 90d supply, fill #1

## 2021-07-14 ENCOUNTER — Ambulatory Visit: Payer: 59 | Admitting: Internal Medicine

## 2021-07-14 ENCOUNTER — Other Ambulatory Visit (HOSPITAL_COMMUNITY): Payer: Self-pay

## 2021-07-14 ENCOUNTER — Encounter: Payer: Self-pay | Admitting: Internal Medicine

## 2021-07-14 VITALS — BP 124/80 | HR 88 | Temp 98.3°F | Ht 64.4 in | Wt 161.8 lb

## 2021-07-14 DIAGNOSIS — E1169 Type 2 diabetes mellitus with other specified complication: Secondary | ICD-10-CM

## 2021-07-14 DIAGNOSIS — Z6827 Body mass index (BMI) 27.0-27.9, adult: Secondary | ICD-10-CM | POA: Diagnosis not present

## 2021-07-14 DIAGNOSIS — E785 Hyperlipidemia, unspecified: Secondary | ICD-10-CM | POA: Diagnosis not present

## 2021-07-14 DIAGNOSIS — I10 Essential (primary) hypertension: Secondary | ICD-10-CM

## 2021-07-14 MED ORDER — ROSUVASTATIN CALCIUM 20 MG PO TABS
20.0000 mg | ORAL_TABLET | Freq: Every day | ORAL | 2 refills | Status: DC
  Filled 2021-07-14: qty 90, 90d supply, fill #0
  Filled 2021-11-15: qty 90, 90d supply, fill #1
  Filled 2022-02-17: qty 90, 90d supply, fill #2

## 2021-07-14 NOTE — Patient Instructions (Signed)

## 2021-07-14 NOTE — Progress Notes (Signed)
Phillip Armstrong,acting as a Education administrator for Phillip Greenland, MD.,have documented all relevant documentation on the behalf of Phillip Greenland, MD,as directed by  Phillip Greenland, MD while in the presence of Phillip Greenland, MD.  This visit occurred during the SARS-CoV-2 public health emergency.  Safety protocols were in place, including screening questions prior to the visit, additional usage of staff PPE, and extensive cleaning of exam room while observing appropriate contact time as indicated for disinfecting solutions.  Subjective:     Patient ID: Phillip Armstrong , male    DOB: 1965-10-03 , 56 y.o.   MRN: [id number]   Chief Complaint  Patient presents with   Diabetes   Hypertension    HPI  Patient presents today for a diabetes and bp check. Patient reports compliance with his meds. He does not have any questions or concerns at this time.  He feels sugars are controlled.   Diabetes He presents for his follow-up diabetic visit. He has type 2 diabetes mellitus. There are no hypoglycemic associated symptoms. There are no diabetic associated symptoms. Pertinent negatives for diabetes include no blurred vision, no chest pain, no polydipsia, no polyphagia and no polyuria. There are no diabetic complications. An ACE inhibitor/angiotensin II receptor blocker is being taken. Eye exam is current.  Hypertension This is a chronic problem. The current episode started yesterday. The problem has been gradually improving since onset. The problem is controlled. Pertinent negatives include no blurred vision, chest pain, palpitations or shortness of breath. Risk factors for coronary artery disease include dyslipidemia and diabetes mellitus. Past treatments include angiotensin blockers and diuretics. The current treatment provides moderate improvement.     Past Medical History:  Diagnosis Date   Diabetes mellitus 2000   T2DM   Hyperlipidemia    Hypertension      Family History  Problem Relation Age  of Onset   Diabetes Son        T1DM   Hypertension Other    Hypertension Father      Current Outpatient Medications:    amLODipine (NORVASC) 10 MG tablet, Take 1 tablet (10 mg total) by mouth daily., Disp: 90 tablet, Rfl: 1   aspirin 81 MG tablet, Take 81 mg by mouth daily., Disp: , Rfl:    Blood Glucose Monitoring Suppl (FREESTYLE LITE) DEVI, Use as directed to check blood sugars 2 times per day dx: e11.65, Disp: 1 each, Rfl: 1   glucose blood (FREESTYLE LITE) test strip, Use as instructed to check blood sugars 2 times daily., Disp: 150 each, Rfl: 2   Insulin Pen Needle (UNIFINE PENTIPS) 32G X 4 MM MISC, USE AS DIRECTED, Disp: 100 each, Rfl: 11   Lancets (FREESTYLE) lancets, Use as instructed to check blood sugars 2 times daily., Disp: 150 each, Rfl: 2   meloxicam (MOBIC) 15 MG tablet, Take 1 tablet (15 mg total) by mouth daily as needed for knee pain., Disp: 30 tablet, Rfl: 1   metFORMIN (GLUCOPHAGE) 1000 MG tablet, Take 1 tablet (1,000 mg total) by mouth 2 (two) times daily with a meal., Disp: 180 tablet, Rfl: 1   Multiple Vitamins-Minerals (MULTIVITAMIN WITH MINERALS) tablet, Take 1 tablet by mouth daily., Disp: , Rfl:    nystatin powder, APPLY TO THE AFFECTED AREA(S) TWICE DAILY AS DIRECTED, Disp: 60 g, Rfl: 2   olmesartan-hydrochlorothiazide (BENICAR HCT) 40-25 MG tablet, Take 1 tablet by mouth daily., Disp: 90 tablet, Rfl: 2   pioglitazone (ACTOS) 15 MG tablet, Take 1 tablet (15 mg total)  by mouth daily., Disp: 90 tablet, Rfl: 2   rosuvastatin (CRESTOR) 20 MG tablet, Take 1 tablet (20 mg total) by mouth daily., Disp: 90 tablet, Rfl: 2   Semaglutide, 1 MG/DOSE, (OZEMPIC, 1 MG/DOSE,) 4 MG/3ML SOPN, Inject 1 mg into the skin once a week., Disp: 9 mL, Rfl: 2   Sod Picosulfate-Mag Ox-Cit Acd (CLENPIQ) 10-3.5-12 MG-GM -GM/160ML SOLN, Take 160 mLs by mouth 2 (two) times as directed. (Patient not taking: Reported on 07/14/2021), Disp: 320 mL, Rfl: 0   No Known Allergies   Review of Systems   Constitutional: Negative.   Eyes:  Negative for blurred vision.  Respiratory: Negative.  Negative for shortness of breath.   Cardiovascular: Negative.  Negative for chest pain and palpitations.  Gastrointestinal: Negative.   Endocrine: Negative for polydipsia, polyphagia and polyuria.  Neurological: Negative.   Psychiatric/Behavioral: Negative.       Today's Vitals   07/14/21 1041  BP: 124/80  Pulse: 88  Temp: 98.3 F (36.8 C)  Weight: 161 lb 12.8 oz (73.4 kg)  Height: 5' 4.4" (1.636 m)  PainSc: 0-No pain   Body mass index is 27.43 kg/m.  Wt Readings from Last 3 Encounters:  07/14/21 161 lb 12.8 oz (73.4 kg)  04/28/21 160 lb 3.2 oz (72.7 kg)  03/25/21 156 lb 3.2 oz (70.9 kg)     Objective:  Physical Exam Vitals and nursing note reviewed.  Constitutional:      Appearance: Normal appearance.  HENT:     Head: Normocephalic and atraumatic.  Eyes:     Extraocular Movements: Extraocular movements intact.  Cardiovascular:     Rate and Rhythm: Normal rate and regular rhythm.     Heart sounds: Normal heart sounds.  Pulmonary:     Effort: Pulmonary effort is normal.     Breath sounds: Normal breath sounds.  Musculoskeletal:     Cervical back: Normal range of motion.  Skin:    General: Skin is warm.  Neurological:     General: No focal deficit present.     Mental Status: He is alert.  Psychiatric:        Mood and Affect: Mood normal.       Assessment And Plan:     1. Dyslipidemia associated with type 2 diabetes mellitus ([place]) Comments: Chronic, I will check labs as listed below. LDL goal <70, currently on rosuvastatin. He does not wish to stop ACTos, c/w Ozempic/metformin.   2. Essential hypertension Comments: Chronic, well controlled. He will c/w olmesartan/hctz and amlodipine daily. He will f/u in 3 months.   3. BMI 27.0-27.9,adult Comments: He is encouraged to aim for at least 150 minutes of exercise per week.    Patient was given opportunity to ask  questions. Patient verbalized understanding of the plan and was able to repeat key elements of the plan. All questions were answered to their satisfaction.   I, Phillip Greenland, MD, have reviewed all documentation for this visit. The documentation on 07/14/21 for the exam, diagnosis, procedures, and orders are all accurate and complete.   IF YOU HAVE BEEN REFERRED TO A SPECIALIST, IT MAY TAKE 1-2 WEEKS TO SCHEDULE/PROCESS THE REFERRAL. IF YOU HAVE NOT HEARD FROM US/SPECIALIST IN TWO WEEKS, PLEASE GIVE [place] A CALL AT [phone].   THE PATIENT IS ENCOURAGED TO PRACTICE SOCIAL DISTANCING DUE TO THE COVID-19 PANDEMIC.

## 2021-07-15 LAB — BMP8+EGFR
BUN/Creatinine Ratio: 13 (ref 9–20)
BUN: 11 mg/dL (ref 6–24)
CO2: 27 mmol/L (ref 20–29)
Calcium: 10.1 mg/dL (ref 8.7–10.2)
Chloride: 99 mmol/L (ref 96–106)
Creatinine, Ser: 0.87 mg/dL (ref 0.76–1.27)
Glucose: 147 mg/dL — ABNORMAL HIGH (ref 70–99)
Potassium: 4.1 mmol/L (ref 3.5–5.2)
Sodium: 140 mmol/L (ref 134–144)
eGFR: 102 mL/min/{1.73_m2} (ref >59)

## 2021-07-15 LAB — HEMOGLOBIN A1C
Est. average glucose Bld gHb Est-mCnc: 177 mg/dL
Hgb A1c MFr Bld: 7.8 % — ABNORMAL HIGH (ref 4.8–5.6)

## 2021-07-19 ENCOUNTER — Other Ambulatory Visit (HOSPITAL_COMMUNITY): Payer: Self-pay

## 2021-07-23 ENCOUNTER — Other Ambulatory Visit (HOSPITAL_COMMUNITY): Payer: Self-pay

## 2021-08-18 ENCOUNTER — Other Ambulatory Visit: Payer: Self-pay

## 2021-08-19 ENCOUNTER — Other Ambulatory Visit (HOSPITAL_COMMUNITY): Payer: Self-pay

## 2021-08-19 ENCOUNTER — Other Ambulatory Visit: Payer: Self-pay | Admitting: Internal Medicine

## 2021-08-19 MED ORDER — OLMESARTAN MEDOXOMIL-HCTZ 40-25 MG PO TABS
1.0000 | ORAL_TABLET | Freq: Every day | ORAL | 2 refills | Status: DC
  Filled 2021-08-19: qty 90, 90d supply, fill #0
  Filled 2021-11-25: qty 90, 90d supply, fill #1
  Filled 2022-02-17: qty 90, 90d supply, fill #2

## 2021-08-20 ENCOUNTER — Other Ambulatory Visit (HOSPITAL_COMMUNITY): Payer: Self-pay

## 2021-09-02 ENCOUNTER — Other Ambulatory Visit: Payer: Self-pay | Admitting: Internal Medicine

## 2021-09-03 ENCOUNTER — Other Ambulatory Visit (HOSPITAL_COMMUNITY): Payer: Self-pay

## 2021-09-06 MED ORDER — MELOXICAM 15 MG PO TABS
15.0000 mg | ORAL_TABLET | Freq: Every day | ORAL | 1 refills | Status: DC | PRN
Start: 2021-09-06 — End: 2022-01-11
  Filled 2021-09-06 – 2021-10-15 (×2): qty 30, 30d supply, fill #0
  Filled 2021-11-25: qty 30, 30d supply, fill #1

## 2021-09-07 ENCOUNTER — Other Ambulatory Visit (HOSPITAL_COMMUNITY): Payer: Self-pay

## 2021-09-21 ENCOUNTER — Other Ambulatory Visit (HOSPITAL_COMMUNITY): Payer: Self-pay

## 2021-09-24 ENCOUNTER — Other Ambulatory Visit (HOSPITAL_COMMUNITY): Payer: Self-pay

## 2021-10-01 DIAGNOSIS — H5203 Hypermetropia, bilateral: Secondary | ICD-10-CM | POA: Diagnosis not present

## 2021-10-01 DIAGNOSIS — H40013 Open angle with borderline findings, low risk, bilateral: Secondary | ICD-10-CM | POA: Diagnosis not present

## 2021-10-01 LAB — HM DIABETES EYE EXAM

## 2021-10-06 ENCOUNTER — Other Ambulatory Visit: Payer: Self-pay | Admitting: Internal Medicine

## 2021-10-06 ENCOUNTER — Other Ambulatory Visit (HOSPITAL_COMMUNITY): Payer: Self-pay

## 2021-10-06 MED ORDER — METFORMIN HCL 1000 MG PO TABS
1000.0000 mg | ORAL_TABLET | Freq: Two times a day (BID) | ORAL | 1 refills | Status: DC
  Filled 2021-10-06: qty 180, 90d supply, fill #0
  Filled 2021-12-31: qty 180, 90d supply, fill #1

## 2021-10-14 ENCOUNTER — Ambulatory Visit: Payer: 59 | Admitting: Internal Medicine

## 2021-10-14 ENCOUNTER — Encounter: Payer: Self-pay | Admitting: Internal Medicine

## 2021-10-14 VITALS — BP 128/80 | HR 87 | Temp 98.1°F | Ht 64.0 in | Wt 161.4 lb

## 2021-10-14 DIAGNOSIS — E1169 Type 2 diabetes mellitus with other specified complication: Secondary | ICD-10-CM | POA: Diagnosis not present

## 2021-10-14 DIAGNOSIS — E785 Hyperlipidemia, unspecified: Secondary | ICD-10-CM

## 2021-10-14 DIAGNOSIS — I10 Essential (primary) hypertension: Secondary | ICD-10-CM

## 2021-10-14 DIAGNOSIS — Z6827 Body mass index (BMI) 27.0-27.9, adult: Secondary | ICD-10-CM

## 2021-10-14 LAB — CMP14+EGFR
ALT: 22 IU/L (ref 0–44)
AST: 22 IU/L (ref 0–40)
Albumin/Globulin Ratio: 2.2 (ref 1.2–2.2)
Albumin: 4.9 g/dL (ref 3.8–4.9)
Alkaline Phosphatase: 79 IU/L (ref 44–121)
BUN/Creatinine Ratio: 12 (ref 9–20)
BUN: 11 mg/dL (ref 6–24)
Bilirubin Total: 0.7 mg/dL (ref 0.0–1.2)
CO2: 24 mmol/L (ref 20–29)
Calcium: 10.1 mg/dL (ref 8.7–10.2)
Chloride: 100 mmol/L (ref 96–106)
Creatinine, Ser: 0.89 mg/dL (ref 0.76–1.27)
Globulin, Total: 2.2 g/dL (ref 1.5–4.5)
Glucose: 141 mg/dL — ABNORMAL HIGH (ref 70–99)
Potassium: 3.9 mmol/L (ref 3.5–5.2)
Sodium: 141 mmol/L (ref 134–144)
Total Protein: 7.1 g/dL (ref 6.0–8.5)
eGFR: 101 mL/min/{1.73_m2} (ref >59)

## 2021-10-14 LAB — HEMOGLOBIN A1C
Est. average glucose Bld gHb Est-mCnc: 186 mg/dL
Hgb A1c MFr Bld: 8.1 % — ABNORMAL HIGH (ref 4.8–5.6)

## 2021-10-14 NOTE — Progress Notes (Signed)
Rich Brave Llittleton,acting as a Education administrator for [name], MD.,have documented all relevant documentation on the behalf of [name], MD,as directed by  [name], MD while in the presence of [name], MD.    Subjective:     Patient ID: Phillip Armstrong , male    DOB: 13-Mar-1965 , 56 y.o.   MRN: [id number]   Chief Complaint  Patient presents with   Diabetes   Hypertension    HPI  Patient presents today for a diabetes and bp check. Patient reports compliance with his meds. He states his BS range from 98-120.  He does not have any questions or concerns at this time.  He prefers to get his flu shot at work.   Diabetes He presents for his follow-up diabetic visit. He has type 2 diabetes mellitus. There are no hypoglycemic associated symptoms. There are no diabetic associated symptoms. Pertinent negatives for diabetes include no blurred vision, no chest pain, no polydipsia, no polyphagia and no polyuria. There are no diabetic complications. An ACE inhibitor/angiotensin II receptor blocker is being taken. Eye exam is current.  Hypertension This is a chronic problem. The current episode started yesterday. The problem has been gradually improving since onset. The problem is controlled. Pertinent negatives include no blurred vision, chest pain, palpitations or shortness of breath. Risk factors for coronary artery disease include dyslipidemia and diabetes mellitus. Past treatments include angiotensin blockers and diuretics. The current treatment provides moderate improvement.     Past Medical History:  Diagnosis Date   Diabetes mellitus 2000   T2DM   Hyperlipidemia    Hypertension      Family History  Problem Relation Age of Onset   Diabetes Son        T1DM   Hypertension Other    Hypertension Father      Current Outpatient Medications:    amLODipine (NORVASC) 10 MG tablet, Take 1 tablet (10 mg total) by mouth daily., Disp: 90 tablet, Rfl: 1   aspirin 81 MG  tablet, Take 81 mg by mouth daily., Disp: , Rfl:    Blood Glucose Monitoring Suppl (FREESTYLE LITE) DEVI, Use as directed to check blood sugars 2 times per day dx: e11.65, Disp: 1 each, Rfl: 1   glucose blood (FREESTYLE LITE) test strip, Use as instructed to check blood sugars 2 times daily., Disp: 150 each, Rfl: 2   Insulin Pen Needle (UNIFINE PENTIPS) 32G X 4 MM MISC, USE AS DIRECTED, Disp: 100 each, Rfl: 11   Lancets (FREESTYLE) lancets, Use as instructed to check blood sugars 2 times daily., Disp: 150 each, Rfl: 2   meloxicam (MOBIC) 15 MG tablet, Take 1 tablet (15 mg total) by mouth daily as needed for knee pain., Disp: 30 tablet, Rfl: 1   metFORMIN (GLUCOPHAGE) 1000 MG tablet, Take 1 tablet (1,000 mg total) by mouth 2 (two) times daily with a meal., Disp: 180 tablet, Rfl: 1   Multiple Vitamins-Minerals (MULTIVITAMIN WITH MINERALS) tablet, Take 1 tablet by mouth daily., Disp: , Rfl:    nystatin powder, APPLY TO THE AFFECTED AREA(S) TWICE DAILY AS DIRECTED, Disp: 60 g, Rfl: 2   olmesartan-hydrochlorothiazide (BENICAR HCT) 40-25 MG tablet, Take 1 tablet by mouth daily., Disp: 90 tablet, Rfl: 2   pioglitazone (ACTOS) 15 MG tablet, Take 1 tablet (15 mg total) by mouth daily., Disp: 90 tablet, Rfl: 2   rosuvastatin (CRESTOR) 20 MG tablet, Take 1 tablet (20 mg total) by mouth daily., Disp: 90 tablet, Rfl: 2  Semaglutide, 1 MG/DOSE, (OZEMPIC, 1 MG/DOSE,) 4 MG/3ML SOPN, Inject 1 mg into the skin once a week., Disp: 9 mL, Rfl: 2   No Known Allergies   Review of Systems  Constitutional: Negative.   Eyes: Negative.  Negative for blurred vision.  Respiratory: Negative.  Negative for shortness of breath.   Cardiovascular: Negative.  Negative for chest pain and palpitations.  Gastrointestinal: Negative.   Endocrine: Negative for polydipsia, polyphagia and polyuria.  Musculoskeletal: Negative.   Skin: Negative.   Neurological: Negative.   Psychiatric/Behavioral: Negative.       Today's Vitals    10/14/21 0826 10/14/21 0851  BP: (!) 140/90 128/80  Pulse: 87   Temp: 98.1 F (36.7 C)   Weight: 161 lb 6.4 oz (73.2 kg)   Height: _0  (1.626 m)   PainSc: 0-No pain    Body mass index is 27.7 kg/m.  Wt Readings from Last 3 Encounters:  10/14/21 161 lb 6.4 oz (73.2 kg)  07/14/21 161 lb 12.8 oz (73.4 kg)  04/28/21 160 lb 3.2 oz (72.7 kg)     Objective:  Physical Exam Vitals and nursing note reviewed.  Constitutional:      Appearance: Normal appearance.  HENT:     Head: Normocephalic and atraumatic.  Eyes:     Extraocular Movements: Extraocular movements intact.  Cardiovascular:     Rate and Rhythm: Normal rate and regular rhythm.     Heart sounds: Normal heart sounds.  Pulmonary:     Effort: Pulmonary effort is normal.     Breath sounds: Normal breath sounds.  Musculoskeletal:     Cervical back: Normal range of motion.  Skin:    General: Skin is warm.  Neurological:     General: No focal deficit present.     Mental Status: He is alert.  Psychiatric:        Mood and Affect: Mood normal.         Assessment And Plan:     1. Dyslipidemia associated with type 2 diabetes mellitus ([place]) Comments: Chronic, I will check labs as below. Goal LDL<70. Jardiance discontinued due to AE-fatigue, urinary frequency and pt states he lost too much weight. He will continue with metformin 1078m twice daily, Actos 137mand Ozempic 71m74meekly. He will rto in Dec 2023 for his next physical exam. HE prefers to get flu shot at work.  - CMP14+EGFR - Hemoglobin A1c  2. Essential hypertension Comments: Chronic, initially uncontrolled. Repeat BP 130/80. He will c/w amlodipine 44m55md olmesartan/hct 40/25mg871mly. No med changes. He is encouraged to avoid processed meats including bacon, sausages and deli meats.   3. BMI 27.0-27.9,adult Comments: He is encouraged to aim for at least 150 minutes of exercise per week.    Patient was given opportunity to ask questions. Patient verbalized  understanding of the plan and was able to repeat key elements of the plan. All questions were answered to their satisfaction.   I, RobynMaximino Greenland have reviewed all documentation for this visit. The documentation on 10/14/21 for the exam, diagnosis, procedures, and orders are all accurate and complete.   IF YOU HAVE BEEN REFERRED TO A SPECIALIST, IT MAY TAKE 1-2 WEEKS TO SCHEDULE/PROCESS THE REFERRAL. IF YOU HAVE NOT HEARD FROM US/SPECIALIST IN TWO WEEKS, PLEASE GIVE US A [place]LL AT [phone].   THE PATIENT IS ENCOURAGED TO PRACTICE SOCIAL DISTANCING DUE TO THE COVID-19 PANDEMIC.

## 2021-10-14 NOTE — Patient Instructions (Signed)

## 2021-10-15 ENCOUNTER — Other Ambulatory Visit (HOSPITAL_COMMUNITY): Payer: Self-pay

## 2021-11-16 ENCOUNTER — Other Ambulatory Visit (HOSPITAL_COMMUNITY): Payer: Self-pay

## 2021-11-25 ENCOUNTER — Other Ambulatory Visit (HOSPITAL_COMMUNITY): Payer: Self-pay

## 2021-12-24 ENCOUNTER — Other Ambulatory Visit: Payer: Self-pay | Admitting: Internal Medicine

## 2021-12-24 ENCOUNTER — Other Ambulatory Visit (HOSPITAL_COMMUNITY): Payer: Self-pay

## 2021-12-24 DIAGNOSIS — I10 Essential (primary) hypertension: Secondary | ICD-10-CM

## 2021-12-24 MED ORDER — AMLODIPINE BESYLATE 10 MG PO TABS
10.0000 mg | ORAL_TABLET | Freq: Every day | ORAL | 1 refills | Status: DC
  Filled 2021-12-24: qty 90, 90d supply, fill #0
  Filled 2022-03-23: qty 90, 90d supply, fill #1

## 2021-12-24 MED ORDER — OZEMPIC (1 MG/DOSE) 4 MG/3ML ~~LOC~~ SOPN
1.0000 mg | PEN_INJECTOR | SUBCUTANEOUS | 2 refills | Status: DC
  Filled 2021-12-24: qty 9, 84d supply, fill #0
  Filled 2022-03-23: qty 9, 84d supply, fill #1

## 2021-12-28 ENCOUNTER — Encounter: Payer: Self-pay | Admitting: Internal Medicine

## 2021-12-28 ENCOUNTER — Ambulatory Visit (INDEPENDENT_AMBULATORY_CARE_PROVIDER_SITE_OTHER): Payer: 59 | Admitting: Internal Medicine

## 2021-12-28 VITALS — BP 132/86 | HR 95 | Temp 98.3°F | Ht 64.0 in | Wt 161.0 lb

## 2021-12-28 DIAGNOSIS — Z0000 Encounter for general adult medical examination without abnormal findings: Secondary | ICD-10-CM | POA: Diagnosis not present

## 2021-12-28 DIAGNOSIS — Z1211 Encounter for screening for malignant neoplasm of colon: Secondary | ICD-10-CM

## 2021-12-28 DIAGNOSIS — E1169 Type 2 diabetes mellitus with other specified complication: Secondary | ICD-10-CM

## 2021-12-28 DIAGNOSIS — Z6827 Body mass index (BMI) 27.0-27.9, adult: Secondary | ICD-10-CM

## 2021-12-28 DIAGNOSIS — I10 Essential (primary) hypertension: Secondary | ICD-10-CM | POA: Diagnosis not present

## 2021-12-28 DIAGNOSIS — E785 Hyperlipidemia, unspecified: Secondary | ICD-10-CM | POA: Diagnosis not present

## 2021-12-28 DIAGNOSIS — Z23 Encounter for immunization: Secondary | ICD-10-CM

## 2021-12-28 LAB — POCT URINALYSIS DIPSTICK
Bilirubin, UA: NEGATIVE
Blood, UA: NEGATIVE
Glucose, UA: NEGATIVE
Ketones, UA: NEGATIVE
Leukocytes, UA: NEGATIVE
Nitrite, UA: NEGATIVE
Protein, UA: NEGATIVE
Spec Grav, UA: 1.01 (ref 1.010–1.025)
Urobilinogen, UA: 0.2 E.U./dL

## 2021-12-28 LAB — POC HEMOCCULT BLD/STL (OFFICE/1-CARD/DIAGNOSTIC)
Card #1 Date: 12122023
Fecal Occult Blood, POC: NEGATIVE

## 2021-12-28 NOTE — Patient Instructions (Signed)
Health Maintenance, Male Adopting a healthy lifestyle and getting preventive care are important in promoting health and wellness. Ask your health care provider about: The right schedule for you to have regular tests and exams. Things you can do on your own to prevent diseases and keep yourself healthy. What should I know about diet, weight, and exercise? Eat a healthy diet  Eat a diet that includes plenty of vegetables, fruits, low-fat dairy products, and lean protein. Do not eat a lot of foods that are high in solid fats, added sugars, or sodium. Maintain a healthy weight Body mass index (BMI) is a measurement that can be used to identify possible weight problems. It estimates body fat based on height and weight. Your health care provider can help determine your BMI and help you achieve or maintain a healthy weight. Get regular exercise Get regular exercise. This is one of the most important things you can do for your health. Most adults should: Exercise for at least 150 minutes each week. The exercise should increase your heart rate and make you sweat (moderate-intensity exercise). Do strengthening exercises at least twice a week. This is in addition to the moderate-intensity exercise. Spend less time sitting. Even light physical activity can be beneficial. Watch cholesterol and blood lipids Have your blood tested for lipids and cholesterol at 56 years of age, then have this test every 5 years. You may need to have your cholesterol levels checked more often if: Your lipid or cholesterol levels are high. You are older than 56 years of age. You are at high risk for heart disease. What should I know about cancer screening? Many types of cancers can be detected early and may often be prevented. Depending on your health history and family history, you may need to have cancer screening at various ages. This may include screening for: Colorectal cancer. Prostate cancer. Skin cancer. Lung  cancer. What should I know about heart disease, diabetes, and high blood pressure? Blood pressure and heart disease High blood pressure causes heart disease and increases the risk of stroke. This is more likely to develop in people who have high blood pressure readings or are overweight. Talk with your health care provider about your target blood pressure readings. Have your blood pressure checked: Every 3-5 years if you are 18-39 years of age. Every year if you are 40 years old or older. If you are between the ages of 65 and 75 and are a current or former smoker, ask your health care provider if you should have a one-time screening for abdominal aortic aneurysm (AAA). Diabetes Have regular diabetes screenings. This checks your fasting blood sugar level. Have the screening done: Once every three years after age 45 if you are at a normal weight and have a low risk for diabetes. More often and at a younger age if you are overweight or have a high risk for diabetes. What should I know about preventing infection? Hepatitis B If you have a higher risk for hepatitis B, you should be screened for this virus. Talk with your health care provider to find out if you are at risk for hepatitis B infection. Hepatitis C Blood testing is recommended for: Everyone born from 1945 through 1965. Anyone with known risk factors for hepatitis C. Sexually transmitted infections (STIs) You should be screened each year for STIs, including gonorrhea and chlamydia, if: You are sexually active and are younger than 56 years of age. You are older than 56 years of age and your   health care provider tells you that you are at risk for this type of infection. Your sexual activity has changed since you were last screened, and you are at increased risk for chlamydia or gonorrhea. Ask your health care provider if you are at risk. Ask your health care provider about whether you are at high risk for HIV. Your health care provider  may recommend a prescription medicine to help prevent HIV infection. If you choose to take medicine to prevent HIV, you should first get tested for HIV. You should then be tested every 3 months for as long as you are taking the medicine. Follow these instructions at home: Alcohol use Do not drink alcohol if your health care provider tells you not to drink. If you drink alcohol: Limit how much you have to 0-2 drinks a day. Know how much alcohol is in your drink. In the [place] one drink equals one 12 oz bottle of beer (355 mL), one 5 oz glass of wine (148 mL), or one 1 oz glass of hard liquor (44 mL). Lifestyle Do not use any products that contain nicotine or tobacco. These products include cigarettes, chewing tobacco, and vaping devices, such as e-cigarettes. If you need help quitting, ask your health care provider. Do not use street drugs. Do not share needles. Ask your health care provider for help if you need support or information about quitting drugs. General instructions Schedule regular health, dental, and eye exams. Stay current with your vaccines. Tell your health care provider if: You often feel depressed. You have ever been abused or do not feel safe at home. Summary Adopting a healthy lifestyle and getting preventive care are important in promoting health and wellness. Follow your health care provider's instructions about healthy diet, exercising, and getting tested or screened for diseases. Follow your health care provider's instructions on monitoring your cholesterol and blood pressure. This information is not intended to replace advice given to you by your health care provider. Make sure you discuss any questions you have with your health care provider. Document Revised: 05/25/2020 Document Reviewed: 05/25/2020 Elsevier Patient Education  2023 Elsevier Inc.  

## 2021-12-28 NOTE — Progress Notes (Signed)
Rich Brave Llittleton,acting as a Education administrator for [name], MD.,have documented all relevant documentation on the behalf of [name], MD,as directed by  [name], MD while in the presence of [name], MD.   Subjective:     Patient ID: Phillip Armstrong , male    DOB: [birth year] , 56 y.o.   MRN: [id number]   Chief Complaint  Patient presents with   Annual Exam   Diabetes   Hypertension    HPI  He is here today for a full physical examination. He has no specific concerns or complaints at this time. He reports compliance with meds. He denies having any headaches, chest pain and shortness of breath.   Diabetes He presents for his follow-up diabetic visit. He has type 2 diabetes mellitus. There are no hypoglycemic associated symptoms. There are no diabetic associated symptoms. Pertinent negatives for diabetes include no blurred vision. There are no diabetic complications. An ACE inhibitor/angiotensin II receptor blocker is being taken. Eye exam is current.  Hypertension This is a chronic problem. The current episode started yesterday. The problem has been gradually improving since onset. The problem is controlled. Pertinent negatives include no blurred vision. Risk factors for coronary artery disease include dyslipidemia and diabetes mellitus. Past treatments include angiotensin blockers and diuretics. The current treatment provides moderate improvement.     Past Medical History:  Diagnosis Date   Diabetes mellitus 2000   T2DM   Hyperlipidemia    Hypertension      Family History  Problem Relation Age of Onset   Diabetes Son        T1DM   Hypertension Other    Hypertension Father      Current Outpatient Medications:    amLODipine (NORVASC) 10 MG tablet, Take 1 tablet (10 mg total) by mouth daily., Disp: 90 tablet, Rfl: 1   aspirin 81 MG tablet, Take 81 mg by mouth daily., Disp: , Rfl:    Blood Glucose Monitoring Suppl (FREESTYLE LITE) DEVI, Use as directed  to check blood sugars 2 times per day dx: e11.65, Disp: 1 each, Rfl: 1   glucose blood (FREESTYLE LITE) test strip, Use as instructed to check blood sugars 2 times daily., Disp: 150 each, Rfl: 2   Insulin Pen Needle (UNIFINE PENTIPS) 32G X 4 MM MISC, USE AS DIRECTED, Disp: 100 each, Rfl: 11   Lancets (FREESTYLE) lancets, Use as instructed to check blood sugars 2 times daily., Disp: 150 each, Rfl: 2   meloxicam (MOBIC) 15 MG tablet, Take 1 tablet (15 mg total) by mouth daily as needed for knee pain., Disp: 30 tablet, Rfl: 1   metFORMIN (GLUCOPHAGE) 1000 MG tablet, Take 1 tablet (1,000 mg total) by mouth 2 (two) times daily with a meal., Disp: 180 tablet, Rfl: 1   Multiple Vitamins-Minerals (MULTIVITAMIN WITH MINERALS) tablet, Take 1 tablet by mouth daily., Disp: , Rfl:    nystatin powder, APPLY TO THE AFFECTED AREA(S) TWICE DAILY AS DIRECTED, Disp: 60 g, Rfl: 2   olmesartan-hydrochlorothiazide (BENICAR HCT) 40-25 MG tablet, Take 1 tablet by mouth daily., Disp: 90 tablet, Rfl: 2   pioglitazone (ACTOS) 15 MG tablet, Take 1 tablet (15 mg total) by mouth daily., Disp: 90 tablet, Rfl: 2   rosuvastatin (CRESTOR) 20 MG tablet, Take 1 tablet (20 mg total) by mouth daily., Disp: 90 tablet, Rfl: 2   Semaglutide, 1 MG/DOSE, (OZEMPIC, 1 MG/DOSE,) 4 MG/3ML SOPN, Inject 1 mg into the skin once a week., Disp: 9 mL, Rfl:  2   No Known Allergies   Men's preventive visit. Patient Health Questionnaire (PHQ-2) is  [place] Office Visit from 07/14/2021 in [facility]  PHQ-2 Total Score 0     . Patient is on a healthy diet. Marital status: Married. Relevant history for alcohol use is:  Social History   Substance and Sexual Activity  Alcohol Use No  . Relevant history for tobacco use is:  Social History   Tobacco Use  Smoking Status Never  Smokeless Tobacco Never  .   Review of Systems  Constitutional: Negative.   HENT: Negative.    Eyes: Negative.  Negative for blurred vision.   Respiratory: Negative.    Cardiovascular: Negative.   Gastrointestinal: Negative.   Endocrine: Negative.   Genitourinary: Negative.   Musculoskeletal: Negative.   Skin: Negative.   Neurological: Negative.   Hematological: Negative.   Psychiatric/Behavioral: Negative.       Today's Vitals   12/28/21 0900  BP: 132/86  Pulse: 95  Temp: 98.3 F (36.8 C)  Weight: 161 lb (73 kg)  Height: _0  (1.626 m)  PainSc: 0-No pain   Body mass index is 27.64 kg/m.  Wt Readings from Last 3 Encounters:  12/28/21 161 lb (73 kg)  10/14/21 161 lb 6.4 oz (73.2 kg)  07/14/21 161 lb 12.8 oz (73.4 kg)     Objective:  Physical Exam Vitals and nursing note reviewed.  Constitutional:      Appearance: Normal appearance.  HENT:     Head: Normocephalic and atraumatic.     Right Ear: Tympanic membrane, ear canal and external ear normal.     Left Ear: Tympanic membrane, ear canal and external ear normal.     Nose:     Comments: Masked     Mouth/Throat:     Comments: Masked  Eyes:     Extraocular Movements: Extraocular movements intact.     Conjunctiva/sclera: Conjunctivae normal.     Pupils: Pupils are equal, round, and reactive to light.  Cardiovascular:     Rate and Rhythm: Normal rate and regular rhythm.     Pulses: Normal pulses.          Dorsalis pedis pulses are 2+ on the right side and 2+ on the left side.     Heart sounds: Normal heart sounds.  Pulmonary:     Effort: Pulmonary effort is normal.     Breath sounds: Normal breath sounds.  Chest:  Breasts:    Right: Normal. No swelling, bleeding, inverted nipple, mass or nipple discharge.     Left: Normal. No swelling, bleeding, inverted nipple, mass or nipple discharge.  Abdominal:     General: Abdomen is flat. Bowel sounds are normal.     Palpations: Abdomen is soft.  Genitourinary:    Prostate: Normal.     Rectum: Normal. Guaiac result negative.  Musculoskeletal:        General: Normal range of motion.     Cervical back:  Normal range of motion and neck supple.  Feet:     Right foot:     Protective Sensation: 5 sites tested.  5 sites sensed.     Skin integrity: Skin integrity normal.     Toenail Condition: Right toenails are normal.     Left foot:     Protective Sensation: 5 sites tested.  5 sites sensed.     Skin integrity: Skin integrity normal.     Toenail Condition: Left toenails are normal.  Skin:  General: Skin is warm.  Neurological:     General: No focal deficit present.     Mental Status: He is alert.  Psychiatric:        Mood and Affect: Mood normal.        Behavior: Behavior normal.      Assessment And Plan:    1. Encounter for general adult medical examination w/o abnormal findings Comments: A full exam was performed. DRE performed, stool heme negative.  PATIENT IS ADVISED TO GET 30-45 MINUTES REGULAR EXERCISE NO LESS THAN FOUR TO FIVE DAYS PER WEEK - BOTH WEIGHTBEARING EXERCISES AND AEROBIC ARE RECOMMENDED.  PATIENT IS ADVISED TO FOLLOW A HEALTHY DIET WITH AT LEAST SIX FRUITS/VEGGIES PER DAY, DECREASE INTAKE OF RED MEAT, AND TO INCREASE FISH INTAKE TO TWO DAYS PER WEEK.  MEATS/FISH SHOULD NOT BE FRIED, BAKED OR BROILED IS PREFERABLE.  IT IS ALSO IMPORTANT TO CUT BACK ON YOUR SUGAR INTAKE. PLEASE AVOID ANYTHING WITH ADDED SUGAR, CORN SYRUP OR OTHER SWEETENERS. IF YOU MUST USE A SWEETENER, YOU CAN TRY STEVIA. IT IS ALSO IMPORTANT TO AVOID ARTIFICIALLY SWEETENERS AND DIET BEVERAGES. LASTLY, I SUGGEST WEARING SPF 50 SUNSCREEN ON EXPOSED PARTS AND ESPECIALLY WHEN IN THE DIRECT SUNLIGHT FOR AN EXTENDED PERIOD OF TIME.  PLEASE AVOID FAST FOOD RESTAURANTS AND INCREASE YOUR WATER INTAKE. - Lipid panel - CBC no Diff - Hemoglobin A1c - PSA - POC Hemoccult Bld/Stl (1-Cd Office Dx) - CMP14+EGFR  2. Dyslipidemia associated with type 2 diabetes mellitus ([place]) Comments: Chronic, diabetic foot exam was performed.  She will f/u in 4 months. I DISCUSSED WITH THE PATIENT AT LENGTH REGARDING THE GOALS OF  GLYCEMIC CONTROL AND POSSIBLE LONG-TERM COMPLICATIONS.  I  ALSO STRESSED THE IMPORTANCE OF COMPLIANCE WITH HOME GLUCOSE MONITORING, DIETARY RESTRICTIONS INCLUDING AVOIDANCE OF SUGARY DRINKS/PROCESSED FOODS,  ALONG WITH REGULAR EXERCISE.  I  ALSO STRESSED THE IMPORTANCE OF ANNUAL EYE EXAMS, SELF FOOT CARE AND COMPLIANCE WITH OFFICE VISITS.  - POCT Urinalysis Dipstick (81002) - Microalbumin / Creatinine Urine Ratio - EKG 12-Lead  3. Essential hypertension Comments: Chronic, fair control. He is aware goal BP<120/80. EKG performed, NSR w/o acute changes. He will c/w amlodipine 63m and olmesartan/hct 40/25 qd.  4. BMI 27.0-27.9,adult Comments: He is encouraged to aim for at least 150 minutes of exercise per week.  Patient was given opportunity to ask questions. Patient verbalized understanding of the plan and was able to repeat key elements of the plan. All questions were answered to their satisfaction.   I, RMaximino Greenland MD, have reviewed all documentation for this visit. The documentation on 12/28/21 for the exam, diagnosis, procedures, and orders are all accurate and complete.   THE PATIENT IS ENCOURAGED TO PRACTICE SOCIAL DISTANCING DUE TO THE COVID-19 PANDEMIC.

## 2021-12-29 LAB — CMP14+EGFR
ALT: 20 IU/L (ref 0–44)
AST: 20 IU/L (ref 0–40)
Albumin/Globulin Ratio: 2.2 (ref 1.2–2.2)
Albumin: 5.1 g/dL — ABNORMAL HIGH (ref 3.8–4.9)
Alkaline Phosphatase: 83 IU/L (ref 44–121)
BUN/Creatinine Ratio: 15 (ref 9–20)
BUN: 13 mg/dL (ref 6–24)
Bilirubin Total: 0.7 mg/dL (ref 0.0–1.2)
CO2: 27 mmol/L (ref 20–29)
Calcium: 10.1 mg/dL (ref 8.7–10.2)
Chloride: 98 mmol/L (ref 96–106)
Creatinine, Ser: 0.87 mg/dL (ref 0.76–1.27)
Globulin, Total: 2.3 g/dL (ref 1.5–4.5)
Glucose: 150 mg/dL — ABNORMAL HIGH (ref 70–99)
Potassium: 3.5 mmol/L (ref 3.5–5.2)
Sodium: 142 mmol/L (ref 134–144)
Total Protein: 7.4 g/dL (ref 6.0–8.5)
eGFR: 101 mL/min/{1.73_m2} (ref >59)

## 2021-12-29 LAB — LIPID PANEL
Chol/HDL Ratio: 2.8 ratio (ref 0.0–5.0)
Cholesterol, Total: 126 mg/dL (ref 100–199)
HDL: 45 mg/dL (ref >39)
LDL Chol Calc (NIH): 68 mg/dL (ref 0–99)
Triglycerides: 60 mg/dL (ref 0–149)
VLDL Cholesterol Cal: 13 mg/dL (ref 5–40)

## 2021-12-29 LAB — CBC
Hematocrit: 43.1 % (ref 37.5–51.0)
Hemoglobin: 14.5 g/dL (ref 13.0–17.7)
MCH: 26.8 pg (ref 26.6–33.0)
MCHC: 33.6 g/dL (ref 31.5–35.7)
MCV: 80 fL (ref 79–97)
Platelets: 310 10*3/uL (ref 150–450)
RBC: 5.42 x10E6/uL (ref 4.14–5.80)
RDW: 12.5 % (ref 11.6–15.4)
WBC: 5.3 10*3/uL (ref 3.4–10.8)

## 2021-12-29 LAB — HEMOGLOBIN A1C
Est. average glucose Bld gHb Est-mCnc: 183 mg/dL
Hgb A1c MFr Bld: 8 % — ABNORMAL HIGH (ref 4.8–5.6)

## 2021-12-29 LAB — MICROALBUMIN / CREATININE URINE RATIO
Creatinine, Urine: 11.5 mg/dL
Microalb/Creat Ratio: 34 mg/g creat — ABNORMAL HIGH (ref 0–29)
Microalbumin, Urine: 3.9 ug/mL

## 2021-12-29 LAB — PSA: Prostate Specific Ag, Serum: 0.3 ng/mL (ref 0.0–4.0)

## 2021-12-31 ENCOUNTER — Other Ambulatory Visit: Payer: Self-pay

## 2021-12-31 ENCOUNTER — Other Ambulatory Visit (HOSPITAL_COMMUNITY): Payer: Self-pay

## 2022-01-11 ENCOUNTER — Other Ambulatory Visit: Payer: Self-pay | Admitting: Internal Medicine

## 2022-01-12 ENCOUNTER — Other Ambulatory Visit (HOSPITAL_COMMUNITY): Payer: Self-pay

## 2022-01-12 MED ORDER — MELOXICAM 15 MG PO TABS
15.0000 mg | ORAL_TABLET | Freq: Every day | ORAL | 1 refills | Status: DC | PRN
  Filled 2022-01-12: qty 30, 30d supply, fill #0
  Filled 2022-02-17: qty 30, 30d supply, fill #1

## 2022-01-12 MED ORDER — PIOGLITAZONE HCL 15 MG PO TABS
15.0000 mg | ORAL_TABLET | Freq: Every day | ORAL | 2 refills | Status: DC
  Filled 2022-01-12: qty 90, 90d supply, fill #0
  Filled 2022-04-04 (×2): qty 90, 90d supply, fill #1

## 2022-01-14 ENCOUNTER — Telehealth: Payer: Self-pay

## 2022-01-14 NOTE — Telephone Encounter (Signed)
Your hba1c is 8.0, this has improved slightly.  Are you willing to see a diabetic educator? She will meet with you at the office, we need to get your hba1c down to 7.0. There is no charge to you. She will likely be able to meet with you after the holidays. Since you are not willing to take [place] or Jardiance, we are running out of options to help you get to goal. Your microalbumin is POS, which means your kidneys are now leaking protein. Therefore the diabetes has started to affect your kidneys. We do want this to progress, so we must get this under control. The meds Wilder Glade and Vania Rea will help to protect the kidneys. Are you willing to take one of these medications with reduced dosing (meaning you wont take every day). I know you were concerned about weight loss and feeling drained, but perhaps MWF dosing will help you to get to goal without you feeling bad.   Cholesterol is at goal. Remaining labs are stable. Please let me know how you wish to proceed.  Patient viewed results on mychart then I spoke with him and he stated he does not want to change his medicine right now he wants to come back in and have his levels checked again and if it is still high he will consider changing meds. Should I schedule him for 3 months? YL,RMA

## 2022-01-21 ENCOUNTER — Other Ambulatory Visit: Payer: Self-pay | Admitting: Internal Medicine

## 2022-01-21 ENCOUNTER — Encounter: Payer: Self-pay | Admitting: Internal Medicine

## 2022-01-21 DIAGNOSIS — E1169 Type 2 diabetes mellitus with other specified complication: Secondary | ICD-10-CM

## 2022-01-24 ENCOUNTER — Other Ambulatory Visit (HOSPITAL_COMMUNITY): Payer: Self-pay

## 2022-01-24 ENCOUNTER — Other Ambulatory Visit: Payer: Self-pay | Admitting: Internal Medicine

## 2022-01-24 MED ORDER — FREESTYLE LITE TEST VI STRP
ORAL_STRIP | Freq: Two times a day (BID) | 2 refills | Status: DC
  Filled 2022-01-24: qty 150, 75d supply, fill #0
  Filled 2022-04-17: qty 150, 75d supply, fill #1
  Filled 2022-06-30: qty 150, 75d supply, fill #2

## 2022-03-08 ENCOUNTER — Other Ambulatory Visit: Payer: Self-pay

## 2022-03-08 ENCOUNTER — Other Ambulatory Visit (HOSPITAL_COMMUNITY): Payer: Self-pay

## 2022-03-23 ENCOUNTER — Other Ambulatory Visit: Payer: Self-pay | Admitting: Internal Medicine

## 2022-03-24 ENCOUNTER — Other Ambulatory Visit (HOSPITAL_COMMUNITY): Payer: Self-pay

## 2022-03-24 MED ORDER — MELOXICAM 15 MG PO TABS
15.0000 mg | ORAL_TABLET | Freq: Every day | ORAL | 1 refills | Status: DC | PRN
  Filled 2022-03-24: qty 30, 30d supply, fill #0
  Filled 2022-05-04: qty 30, 30d supply, fill #1

## 2022-04-04 ENCOUNTER — Other Ambulatory Visit (HOSPITAL_COMMUNITY): Payer: Self-pay

## 2022-04-04 ENCOUNTER — Other Ambulatory Visit: Payer: Self-pay | Admitting: Internal Medicine

## 2022-04-04 MED ORDER — METFORMIN HCL 1000 MG PO TABS
1000.0000 mg | ORAL_TABLET | Freq: Two times a day (BID) | ORAL | 1 refills | Status: DC
  Filled 2022-04-04: qty 180, 90d supply, fill #0
  Filled 2022-06-30: qty 180, 90d supply, fill #1

## 2022-04-18 ENCOUNTER — Other Ambulatory Visit (HOSPITAL_COMMUNITY): Payer: Self-pay

## 2022-04-28 ENCOUNTER — Encounter: Payer: Self-pay | Admitting: Internal Medicine

## 2022-04-28 ENCOUNTER — Other Ambulatory Visit (HOSPITAL_COMMUNITY): Payer: Self-pay

## 2022-04-28 ENCOUNTER — Ambulatory Visit: Payer: Commercial Managed Care - PPO | Admitting: Internal Medicine

## 2022-04-28 VITALS — BP 128/70 | HR 85 | Temp 98.3°F | Ht 64.0 in | Wt 157.4 lb

## 2022-04-28 DIAGNOSIS — E663 Overweight: Secondary | ICD-10-CM

## 2022-04-28 DIAGNOSIS — E1169 Type 2 diabetes mellitus with other specified complication: Secondary | ICD-10-CM | POA: Diagnosis not present

## 2022-04-28 DIAGNOSIS — I10 Essential (primary) hypertension: Secondary | ICD-10-CM

## 2022-04-28 DIAGNOSIS — E785 Hyperlipidemia, unspecified: Secondary | ICD-10-CM

## 2022-04-28 DIAGNOSIS — Z6827 Body mass index (BMI) 27.0-27.9, adult: Secondary | ICD-10-CM | POA: Diagnosis not present

## 2022-04-28 DIAGNOSIS — E7800 Pure hypercholesterolemia, unspecified: Secondary | ICD-10-CM

## 2022-04-28 MED ORDER — UNIFINE PENTIPS 32G X 4 MM MISC
11 refills | Status: AC
Start: 2022-04-28 — End: 2023-04-28
  Filled 2022-04-28: qty 100, fill #0
  Filled 2022-06-22: qty 100, 25d supply, fill #0
  Filled 2022-10-05: qty 100, 25d supply, fill #1

## 2022-04-28 MED ORDER — OZEMPIC (1 MG/DOSE) 4 MG/3ML ~~LOC~~ SOPN
1.0000 mg | PEN_INJECTOR | SUBCUTANEOUS | 2 refills | Status: DC
Start: 2022-04-28 — End: 2023-03-21
  Filled 2022-04-28 – 2022-06-08 (×2): qty 9, 84d supply, fill #0
  Filled 2022-09-04: qty 9, 84d supply, fill #1
  Filled 2022-12-27: qty 9, 84d supply, fill #2

## 2022-04-28 MED ORDER — PIOGLITAZONE HCL 15 MG PO TABS
15.0000 mg | ORAL_TABLET | Freq: Every day | ORAL | 2 refills | Status: DC
Start: 2022-04-28 — End: 2023-04-04
  Filled 2022-04-28 – 2022-07-08 (×2): qty 90, 90d supply, fill #0
  Filled 2022-10-05: qty 90, 90d supply, fill #1
  Filled 2023-01-10: qty 90, 90d supply, fill #2

## 2022-04-28 MED ORDER — OLMESARTAN MEDOXOMIL-HCTZ 40-25 MG PO TABS
1.0000 | ORAL_TABLET | Freq: Every day | ORAL | 2 refills | Status: DC
Start: 2022-04-28 — End: 2023-03-01
  Filled 2022-04-28 – 2022-05-24 (×2): qty 90, 90d supply, fill #0
  Filled 2022-08-25: qty 90, 90d supply, fill #1
  Filled 2022-11-16: qty 90, 90d supply, fill #2

## 2022-04-28 MED ORDER — AMLODIPINE BESYLATE 10 MG PO TABS
10.0000 mg | ORAL_TABLET | Freq: Every day | ORAL | 1 refills | Status: DC
Start: 2022-04-28 — End: 2022-12-14
  Filled 2022-04-28 – 2022-06-22 (×2): qty 90, 90d supply, fill #0
  Filled 2022-07-08 – 2022-09-20 (×2): qty 90, 90d supply, fill #1

## 2022-04-28 MED ORDER — ROSUVASTATIN CALCIUM 20 MG PO TABS
20.0000 mg | ORAL_TABLET | Freq: Every day | ORAL | 2 refills | Status: DC
Start: 2022-04-28 — End: 2023-03-21
  Filled 2022-04-28 – 2022-06-08 (×2): qty 90, 90d supply, fill #0
  Filled 2022-09-20: qty 90, 90d supply, fill #1
  Filled 2022-12-27: qty 90, 90d supply, fill #2

## 2022-04-28 NOTE — Progress Notes (Signed)
I,Victoria T Hamilton,acting as a scribe for Gwynneth Aliment, MD.,have documented all relevant documentation on the behalf of Gwynneth Aliment, MD,as directed by  Gwynneth Aliment, MD while in the presence of Gwynneth Aliment, MD.    Subjective:     Patient ID: Phillip Armstrong , male    DOB: [birth year] , 57 y.o.   MRN: [id number]   Chief Complaint  Patient presents with   Diabetes   Hypertension    HPI  Patient presents today for a diabetes and bp check. Patient reports compliance with his meds. He states his BS range from 99-123. He denies headache, chest pain, and SOB.  He denies having any specific questions or concerns.   Diabetes He presents for his follow-up diabetic visit. He has type 2 diabetes mellitus. There are no hypoglycemic associated symptoms. There are no diabetic associated symptoms. Pertinent negatives for diabetes include no blurred vision, no polydipsia, no polyphagia and no polyuria. There are no diabetic complications. An ACE inhibitor/angiotensin II receptor blocker is being taken. Eye exam is current.  Hypertension This is a chronic problem. The current episode started yesterday. The problem has been gradually improving since onset. The problem is controlled. Pertinent negatives include no blurred vision or palpitations. Risk factors for coronary artery disease include dyslipidemia and diabetes mellitus. Past treatments include angiotensin blockers and diuretics. The current treatment provides moderate improvement.     Past Medical History:  Diagnosis Date   Diabetes mellitus 2000   T2DM   Hyperlipidemia    Hypertension      Family History  Problem Relation Age of Onset   Diabetes Son        T1DM   Hypertension Other    Hypertension Father      Current Outpatient Medications:    aspirin 81 MG tablet, Take 81 mg by mouth daily., Disp: , Rfl:    Blood Glucose Monitoring Suppl (FREESTYLE LITE) DEVI, Use as directed to check blood sugars 2 times per day  dx: e11.65, Disp: 1 each, Rfl: 1   glucose blood (FREESTYLE LITE) test strip, Use to check blood sugar 2 (two) times daily as directed, Disp: 150 each, Rfl: 2   Lancets (FREESTYLE) lancets, Use as instructed to check blood sugars 2 times daily., Disp: 150 each, Rfl: 2   meloxicam (MOBIC) 15 MG tablet, Take 1 tablet (15 mg total) by mouth daily as needed for knee pain., Disp: 30 tablet, Rfl: 1   metFORMIN (GLUCOPHAGE) 1000 MG tablet, Take 1 tablet (1,000 mg total) by mouth 2 (two) times daily with a meal., Disp: 180 tablet, Rfl: 1   Multiple Vitamins-Minerals (MULTIVITAMIN WITH MINERALS) tablet, Take 1 tablet by mouth daily., Disp: , Rfl:    nystatin powder, APPLY TO THE AFFECTED AREA(S) TWICE DAILY AS DIRECTED, Disp: 60 g, Rfl: 2   amLODipine (NORVASC) 10 MG tablet, Take 1 tablet (10 mg total) by mouth daily., Disp: 90 tablet, Rfl: 1   Insulin Pen Needle (UNIFINE PENTIPS) 32G X 4 MM MISC, USE AS DIRECTED, Disp: 100 each, Rfl: 11   olmesartan-hydrochlorothiazide (BENICAR HCT) 40-25 MG tablet, Take 1 tablet by mouth daily., Disp: 90 tablet, Rfl: 2   pioglitazone (ACTOS) 15 MG tablet, Take 1 tablet (15 mg total) by mouth daily., Disp: 90 tablet, Rfl: 2   rosuvastatin (CRESTOR) 20 MG tablet, Take 1 tablet (20 mg total) by mouth daily., Disp: 90 tablet, Rfl: 2   Semaglutide, 1 MG/DOSE, (OZEMPIC, 1 MG/DOSE,) 4 MG/3ML SOPN, Inject 1 mg  into the skin once a week., Disp: 9 mL, Rfl: 2   No Known Allergies   Review of Systems  Constitutional: Negative.   Eyes:  Negative for blurred vision.  Respiratory: Negative.    Cardiovascular: Negative.  Negative for palpitations.  Endocrine: Negative.  Negative for polydipsia, polyphagia and polyuria.  Musculoskeletal: Negative.   Skin: Negative.   Allergic/Immunologic: Negative.   Neurological: Negative.   Psychiatric/Behavioral: Negative.       Today's Vitals   04/28/22 0824  BP: (!) 130/90  Pulse: 85  Temp: 98.3 F (36.8 C)  SpO2: 98%  Weight: 157  lb 6.4 oz (71.4 kg)  Height: 5\' 4"  (1.626 m)   Body mass index is 27.02 kg/m.  Wt Readings from Last 3 Encounters:  04/28/22 157 lb 6.4 oz (71.4 kg)  12/28/21 161 lb (73 kg)  10/14/21 161 lb 6.4 oz (73.2 kg)    Objective:  Physical Exam Vitals and nursing note reviewed.  Constitutional:      Appearance: Normal appearance.  HENT:     Head: Normocephalic and atraumatic.  Eyes:     Extraocular Movements: Extraocular movements intact.  Cardiovascular:     Rate and Rhythm: Normal rate and regular rhythm.     Heart sounds: Normal heart sounds.  Pulmonary:     Effort: Pulmonary effort is normal.     Breath sounds: Normal breath sounds.  Musculoskeletal:     Cervical back: Normal range of motion.  Skin:    General: Skin is warm.  Neurological:     General: No focal deficit present.     Mental Status: He is alert.  Psychiatric:        Mood and Affect: Mood normal.       Assessment And Plan:     1. Dyslipidemia associated with type 2 diabetes mellitus Comments: Chronic, unfortunately Endo appt not until October. He will c/w metformin, Actos and Ozempic for now. She will f/u 4 months. - CMP14+EGFR - Hemoglobin A1c - pioglitazone (ACTOS) 15 MG tablet; Take 1 tablet (15 mg total) by mouth daily.  Dispense: 90 tablet; Refill: 2 - Insulin Pen Needle (UNIFINE PENTIPS) 32G X 4 MM MISC; USE AS DIRECTED  Dispense: 100 each; Refill: 11 - Semaglutide, 1 MG/DOSE, (OZEMPIC, 1 MG/DOSE,) 4 MG/3ML SOPN; Inject 1 mg into the skin once a week.  Dispense: 9 mL; Refill: 2 - TSH  2. Essential hypertension Comments: Chronic, fair control. Repeat BP 128/70. He wil lc/w olmesartan/hctz 40/25 and amlodipine 10mg  daily. - amLODipine (NORVASC) 10 MG tablet; Take 1 tablet (10 mg total) by mouth daily.  Dispense: 90 tablet; Refill: 1 - olmesartan-hydrochlorothiazide (BENICAR HCT) 40-25 MG tablet; Take 1 tablet by mouth daily.  Dispense: 90 tablet; Refill: 2  3. Pure hypercholesterolemia Comments:  Chronic, he will c/w rosuvastatin 20mg  daily. Refill meds sent to the pharmacy. - rosuvastatin (CRESTOR) 20 MG tablet; Take 1 tablet (20 mg total) by mouth daily.  Dispense: 90 tablet; Refill: 2  4. Overweight with body mass index (BMI) of 27 to 27.9 in adult Comments: He is encouraged to aim for at least 150 minutes of exercise/week.     Patient was given opportunity to ask questions. Patient verbalized understanding of the plan and was able to repeat key elements of the plan. All questions were answered to their satisfaction.   I, Gwynneth Aliment, MD, have reviewed all documentation for this visit. The documentation on 04/28/22 for the exam, diagnosis, procedures, and orders are all accurate and  complete.   IF YOU HAVE BEEN REFERRED TO A SPECIALIST, IT MAY TAKE 1-2 WEEKS TO SCHEDULE/PROCESS THE REFERRAL. IF YOU HAVE NOT HEARD FROM US/SPECIALIST IN TWO WEEKS, PLEASE GIVE [place] A CALL AT [phone].   THE PATIENT IS ENCOURAGED TO PRACTICE SOCIAL DISTANCING DUE TO THE COVID-19 PANDEMIC.

## 2022-04-28 NOTE — Patient Instructions (Signed)

## 2022-04-29 LAB — CMP14+EGFR
ALT: 17 IU/L (ref 0–44)
AST: 17 IU/L (ref 0–40)
Albumin/Globulin Ratio: 2.2 (ref 1.2–2.2)
Albumin: 4.8 g/dL (ref 3.8–4.9)
Alkaline Phosphatase: 82 IU/L (ref 44–121)
BUN/Creatinine Ratio: 14 (ref 9–20)
BUN: 12 mg/dL (ref 6–24)
Bilirubin Total: 0.7 mg/dL (ref 0.0–1.2)
CO2: 25 mmol/L (ref 20–29)
Calcium: 9.9 mg/dL (ref 8.7–10.2)
Chloride: 97 mmol/L (ref 96–106)
Creatinine, Ser: 0.84 mg/dL (ref 0.76–1.27)
Globulin, Total: 2.2 g/dL (ref 1.5–4.5)
Glucose: 153 mg/dL — ABNORMAL HIGH (ref 70–99)
Potassium: 3.8 mmol/L (ref 3.5–5.2)
Sodium: 138 mmol/L (ref 134–144)
Total Protein: 7 g/dL (ref 6.0–8.5)
eGFR: 102 mL/min/{1.73_m2} (ref >59)

## 2022-04-29 LAB — TSH: TSH: 2.91 u[IU]/mL (ref 0.450–4.500)

## 2022-04-29 LAB — HEMOGLOBIN A1C
Est. average glucose Bld gHb Est-mCnc: 169 mg/dL
Hgb A1c MFr Bld: 7.5 % — ABNORMAL HIGH (ref 4.8–5.6)

## 2022-05-05 ENCOUNTER — Other Ambulatory Visit (HOSPITAL_COMMUNITY): Payer: Self-pay

## 2022-05-20 ENCOUNTER — Other Ambulatory Visit (HOSPITAL_COMMUNITY): Payer: Self-pay

## 2022-05-24 ENCOUNTER — Other Ambulatory Visit: Payer: Self-pay

## 2022-05-24 ENCOUNTER — Other Ambulatory Visit (HOSPITAL_COMMUNITY): Payer: Self-pay

## 2022-06-08 ENCOUNTER — Other Ambulatory Visit (HOSPITAL_COMMUNITY): Payer: Self-pay

## 2022-06-08 ENCOUNTER — Other Ambulatory Visit: Payer: Self-pay

## 2022-06-19 ENCOUNTER — Other Ambulatory Visit: Payer: Self-pay | Admitting: Internal Medicine

## 2022-06-20 ENCOUNTER — Other Ambulatory Visit (HOSPITAL_COMMUNITY): Payer: Self-pay

## 2022-06-20 MED ORDER — MELOXICAM 15 MG PO TABS
15.0000 mg | ORAL_TABLET | Freq: Every day | ORAL | 1 refills | Status: DC | PRN
  Filled 2022-06-20: qty 30, 30d supply, fill #0
  Filled 2022-08-03 – 2022-08-04 (×2): qty 30, 30d supply, fill #1

## 2022-06-21 ENCOUNTER — Other Ambulatory Visit (HOSPITAL_COMMUNITY): Payer: Self-pay

## 2022-06-22 ENCOUNTER — Other Ambulatory Visit (HOSPITAL_COMMUNITY): Payer: Self-pay

## 2022-06-30 ENCOUNTER — Other Ambulatory Visit (HOSPITAL_COMMUNITY): Payer: Self-pay

## 2022-07-08 ENCOUNTER — Other Ambulatory Visit: Payer: Self-pay

## 2022-07-08 ENCOUNTER — Other Ambulatory Visit (HOSPITAL_COMMUNITY): Payer: Self-pay

## 2022-08-04 ENCOUNTER — Other Ambulatory Visit (HOSPITAL_COMMUNITY): Payer: Self-pay

## 2022-09-05 ENCOUNTER — Other Ambulatory Visit (HOSPITAL_COMMUNITY): Payer: Self-pay

## 2022-09-13 NOTE — Patient Instructions (Signed)

## 2022-09-14 ENCOUNTER — Encounter: Payer: Self-pay | Admitting: Internal Medicine

## 2022-09-14 ENCOUNTER — Ambulatory Visit: Payer: Commercial Managed Care - PPO | Admitting: Internal Medicine

## 2022-09-14 VITALS — BP 110/80 | HR 83 | Temp 97.9°F | Ht 64.0 in | Wt 154.0 lb

## 2022-09-14 DIAGNOSIS — I10 Essential (primary) hypertension: Secondary | ICD-10-CM

## 2022-09-14 DIAGNOSIS — E1169 Type 2 diabetes mellitus with other specified complication: Secondary | ICD-10-CM

## 2022-09-14 DIAGNOSIS — E7800 Pure hypercholesterolemia, unspecified: Secondary | ICD-10-CM

## 2022-09-14 DIAGNOSIS — E663 Overweight: Secondary | ICD-10-CM

## 2022-09-14 DIAGNOSIS — Z6826 Body mass index (BMI) 26.0-26.9, adult: Secondary | ICD-10-CM | POA: Diagnosis not present

## 2022-09-14 DIAGNOSIS — E785 Hyperlipidemia, unspecified: Secondary | ICD-10-CM | POA: Diagnosis not present

## 2022-09-14 LAB — CMP14+EGFR
ALT: 18 IU/L (ref 0–44)
AST: 20 IU/L (ref 0–40)
Albumin: 4.6 g/dL (ref 3.8–4.9)
Alkaline Phosphatase: 86 IU/L (ref 44–121)
BUN/Creatinine Ratio: 16 (ref 9–20)
BUN: 13 mg/dL (ref 6–24)
Bilirubin Total: 0.6 mg/dL (ref 0.0–1.2)
CO2: 25 mmol/L (ref 20–29)
Calcium: 9.7 mg/dL (ref 8.7–10.2)
Chloride: 98 mmol/L (ref 96–106)
Creatinine, Ser: 0.8 mg/dL (ref 0.76–1.27)
Globulin, Total: 2.5 g/dL (ref 1.5–4.5)
Glucose: 136 mg/dL — ABNORMAL HIGH (ref 70–99)
Potassium: 3.9 mmol/L (ref 3.5–5.2)
Sodium: 139 mmol/L (ref 134–144)
Total Protein: 7.1 g/dL (ref 6.0–8.5)
eGFR: 104 mL/min/{1.73_m2} (ref >59)

## 2022-09-14 LAB — CBC
Hematocrit: 43.8 % (ref 37.5–51.0)
Hemoglobin: 13.9 g/dL (ref 13.0–17.7)
MCH: 25.8 pg — ABNORMAL LOW (ref 26.6–33.0)
MCHC: 31.7 g/dL (ref 31.5–35.7)
MCV: 81 fL (ref 79–97)
Platelets: 300 10*3/uL (ref 150–450)
RBC: 5.38 x10E6/uL (ref 4.14–5.80)
RDW: 13.1 % (ref 11.6–15.4)
WBC: 4 10*3/uL (ref 3.4–10.8)

## 2022-09-14 LAB — LIPID PANEL
Chol/HDL Ratio: 2.6 ratio (ref 0.0–5.0)
Cholesterol, Total: 129 mg/dL (ref 100–199)
HDL: 49 mg/dL (ref >39)
LDL Chol Calc (NIH): 63 mg/dL (ref 0–99)
Triglycerides: 86 mg/dL (ref 0–149)
VLDL Cholesterol Cal: 17 mg/dL (ref 5–40)

## 2022-09-14 LAB — HEMOGLOBIN A1C
Est. average glucose Bld gHb Est-mCnc: 174 mg/dL
Hgb A1c MFr Bld: 7.7 % — ABNORMAL HIGH (ref 4.8–5.6)

## 2022-09-14 NOTE — Progress Notes (Signed)
I,Victoria T Deloria Lair, CMA,acting as a Neurosurgeon for Gwynneth Aliment, MD.,have documented all relevant documentation on the behalf of Gwynneth Aliment, MD,as directed by  Gwynneth Aliment, MD while in the presence of Gwynneth Aliment, MD.  Subjective:  Patient ID: Phillip Armstrong , male    DOB: 28-Jul-1965 , 57 y.o.   MRN: [id number]  Chief Complaint  Patient presents with   Diabetes   Hypertension    HPI  Patient presents today for a diabetes and bp check. Patient reports compliance with his meds. He denies headache, chest pain, and SOB.  He denies having any specific questions or concerns.  He states his sugars are typically 99-105 in the mornings and 100s in the evenings.    Diabetes He presents for his follow-up diabetic visit. He has type 2 diabetes mellitus. There are no hypoglycemic associated symptoms. There are no diabetic associated symptoms. Pertinent negatives for diabetes include no blurred vision, no polydipsia, no polyphagia and no polyuria. There are no diabetic complications. An ACE inhibitor/angiotensin II receptor blocker is being taken. Eye exam is current.  Hypertension This is a chronic problem. The current episode started yesterday. The problem has been gradually improving since onset. The problem is controlled. Pertinent negatives include no blurred vision or palpitations. Risk factors for coronary artery disease include dyslipidemia and diabetes mellitus. Past treatments include angiotensin blockers and diuretics. The current treatment provides moderate improvement.     Past Medical History:  Diagnosis Date   Diabetes mellitus 2000   T2DM   Hyperlipidemia    Hypertension      Family History  Problem Relation Age of Onset   Diabetes Son        T1DM   Hypertension Other    Hypertension Father      Current Outpatient Medications:    amLODipine (NORVASC) 10 MG tablet, Take 1 tablet (10 mg total) by mouth daily., Disp: 90 tablet, Rfl: 1   aspirin 81 MG tablet, Take  81 mg by mouth daily., Disp: , Rfl:    Blood Glucose Monitoring Suppl (FREESTYLE LITE) DEVI, Use as directed to check blood sugars 2 times per day dx: e11.65, Disp: 1 each, Rfl: 1   glucose blood (FREESTYLE LITE) test strip, Use to check blood sugar 2 (two) times daily as directed, Disp: 150 each, Rfl: 2   Insulin Pen Needle (UNIFINE PENTIPS) 32G X 4 MM MISC, USE AS DIRECTED, Disp: 100 each, Rfl: 11   Lancets (FREESTYLE) lancets, Use as instructed to check blood sugars 2 times daily., Disp: 150 each, Rfl: 2   meloxicam (MOBIC) 15 MG tablet, Take 1 tablet (15 mg total) by mouth daily as needed for knee pain., Disp: 30 tablet, Rfl: 1   metFORMIN (GLUCOPHAGE) 1000 MG tablet, Take 1 tablet (1,000 mg total) by mouth 2 (two) times daily with a meal., Disp: 180 tablet, Rfl: 1   Multiple Vitamins-Minerals (MULTIVITAMIN WITH MINERALS) tablet, Take 1 tablet by mouth daily., Disp: , Rfl:    nystatin powder, APPLY TO THE AFFECTED AREA(S) TWICE DAILY AS DIRECTED, Disp: 60 g, Rfl: 2   olmesartan-hydrochlorothiazide (BENICAR HCT) 40-25 MG tablet, Take 1 tablet by mouth daily., Disp: 90 tablet, Rfl: 2   pioglitazone (ACTOS) 15 MG tablet, Take 1 tablet (15 mg total) by mouth daily., Disp: 90 tablet, Rfl: 2   rosuvastatin (CRESTOR) 20 MG tablet, Take 1 tablet (20 mg total) by mouth daily., Disp: 90 tablet, Rfl: 2   Semaglutide, 1 MG/DOSE, (OZEMPIC, 1 MG/DOSE,) 4  MG/3ML SOPN, Inject 1 mg into the skin once a week., Disp: 9 mL, Rfl: 2   No Known Allergies   Review of Systems  Constitutional: Negative.   HENT: Negative.    Eyes:  Negative for blurred vision.  Respiratory: Negative.    Cardiovascular: Negative.  Negative for palpitations.  Gastrointestinal: Negative.   Endocrine: Negative for polydipsia, polyphagia and polyuria.  Skin: Negative.   Allergic/Immunologic: Negative.   Neurological: Negative.   Hematological: Negative.      Today's Vitals   09/14/22 1014 09/14/22 1029  BP: (!) 110/90 110/80   Pulse: 83   Temp: 97.9 F (36.6 C)   SpO2: 99%   Weight: 154 lb (69.9 kg)   Height: 5\' 4"  (1.626 m)    Body mass index is 26.43 kg/m.  Wt Readings from Last 3 Encounters:  09/14/22 154 lb (69.9 kg)  04/28/22 157 lb 6.4 oz (71.4 kg)  12/28/21 161 lb (73 kg)     Objective:  Physical Exam Vitals and nursing note reviewed.  Constitutional:      Appearance: Normal appearance.  HENT:     Head: Normocephalic and atraumatic.  Eyes:     Extraocular Movements: Extraocular movements intact.  Cardiovascular:     Rate and Rhythm: Normal rate and regular rhythm.     Heart sounds: Normal heart sounds.  Pulmonary:     Effort: Pulmonary effort is normal.     Breath sounds: Normal breath sounds.  Musculoskeletal:     Cervical back: Normal range of motion.  Skin:    General: Skin is warm.  Neurological:     General: No focal deficit present.     Mental Status: He is alert.  Psychiatric:        Mood and Affect: Mood normal.         Assessment And Plan:  Dyslipidemia associated with type 2 diabetes mellitus (HCC) Assessment & Plan: Chronic, LDL goal is less than 70. Unfortunately, he does not wish to take [place] or Jardiance due to increased urination. He will continue with metformin 1000mg  twice daily, Ozempic 1mg  weekly and pioglitazone 15mg  daily. I have tried to wean him off of pioglitazone; however, he prefers to stay on this medication.   Orders: -     CMP14+EGFR -     CBC -     Lipid panel -     Hemoglobin A1c  Essential hypertension Assessment & Plan: Chronic, controlled. He will continue with amlodipine 10mg  daily and olmesartan/hct 40/25mg  daily. Advised to follow low sodium diet.   Orders: -     CMP14+EGFR -     Lipid panel  Overweight with body mass index (BMI) of 26 to 26.9 in adult Assessment & Plan: He is encouraged to aim for at least 150 minutes of exercise per week.       Return if symptoms worsen or fail to improve.  Patient was given  opportunity to ask questions. Patient verbalized understanding of the plan and was able to repeat key elements of the plan. All questions were answered to their satisfaction.    I, Gwynneth Aliment, MD, have reviewed all documentation for this visit. The documentation on 09/14/22 for the exam, diagnosis, procedures, and orders are all accurate and complete.   IF YOU HAVE BEEN REFERRED TO A SPECIALIST, IT MAY TAKE 1-2 WEEKS TO SCHEDULE/PROCESS THE REFERRAL. IF YOU HAVE NOT HEARD FROM US/SPECIALIST IN TWO WEEKS, PLEASE GIVE [place] A CALL AT [phone].   THE  PATIENT IS ENCOURAGED TO PRACTICE SOCIAL DISTANCING DUE TO THE COVID-19 PANDEMIC.

## 2022-09-15 ENCOUNTER — Other Ambulatory Visit: Payer: Self-pay | Admitting: Internal Medicine

## 2022-09-15 DIAGNOSIS — I10 Essential (primary) hypertension: Secondary | ICD-10-CM

## 2022-09-15 DIAGNOSIS — E1169 Type 2 diabetes mellitus with other specified complication: Secondary | ICD-10-CM

## 2022-09-25 ENCOUNTER — Other Ambulatory Visit: Payer: Commercial Managed Care - PPO

## 2022-09-25 DIAGNOSIS — E663 Overweight: Secondary | ICD-10-CM | POA: Insufficient documentation

## 2022-09-25 NOTE — Assessment & Plan Note (Signed)
Chronic, LDL goal is less than 70. Unfortunately, he does not wish to take [place] or Jardiance due to increased urination. He will continue with metformin 1000mg  twice daily, Ozempic 1mg  weekly and pioglitazone 15mg  daily. I have tried to wean him off of pioglitazone; however, he prefers to stay on this medication.

## 2022-09-25 NOTE — Assessment & Plan Note (Signed)
He is encouraged to aim for at least 150 minutes of exercise per week.

## 2022-09-25 NOTE — Assessment & Plan Note (Signed)
Chronic, controlled. He will continue with amlodipine 10mg  daily and olmesartan/hct 40/25mg  daily. Advised to follow low sodium diet.

## 2022-09-27 ENCOUNTER — Other Ambulatory Visit (HOSPITAL_COMMUNITY): Payer: Self-pay

## 2022-09-27 ENCOUNTER — Other Ambulatory Visit: Payer: Self-pay | Admitting: Internal Medicine

## 2022-09-27 MED ORDER — METFORMIN HCL 1000 MG PO TABS
1000.0000 mg | ORAL_TABLET | Freq: Two times a day (BID) | ORAL | 1 refills | Status: DC
  Filled 2022-09-27: qty 180, 90d supply, fill #0
  Filled 2022-12-14: qty 180, 90d supply, fill #1

## 2022-09-27 MED ORDER — MELOXICAM 15 MG PO TABS
15.0000 mg | ORAL_TABLET | Freq: Every day | ORAL | 1 refills | Status: DC | PRN
  Filled 2022-09-27: qty 30, 30d supply, fill #0
  Filled 2022-11-16: qty 30, 30d supply, fill #1

## 2022-09-29 ENCOUNTER — Other Ambulatory Visit (HOSPITAL_COMMUNITY): Payer: Self-pay

## 2022-10-02 ENCOUNTER — Encounter: Payer: Self-pay | Admitting: Internal Medicine

## 2022-10-02 ENCOUNTER — Other Ambulatory Visit: Payer: Commercial Managed Care - PPO

## 2022-10-02 NOTE — Progress Notes (Unsigned)
10/02/2022 Name: Phillip Armstrong MRN: [id number] DOB: 23-Feb-1965  No chief complaint on file.   Phillip Armstrong is a 57 y.o. year old male who presented for a telephone visit.   They were referred to the pharmacist by their PCP for assistance in managing diabetes.    Subjective:  Care Team: Primary Care Provider: Dorothyann Peng, MD ; Next Scheduled Visit: 01/25/2023  Medication Access/Adherence  Current Pharmacy:  [facility] [address] [place] [address] Phone: [phone] Fax: [phone]  [facility] [address] [place] [address] Phone: [phone] Fax: [phone]  [id number] [place], [place] - [address] AT [facility] [address]  [place] [address] Phone: [phone] Fax: [phone]   Patient reports affordability concerns with their medications: No  Patient reports access/transportation concerns to their pharmacy: No  Patient reports adherence concerns with their medications:  No     Diabetes:  Current medications: Ozempic 1 mg weekly, pioglitazone 15 mg daily, metformin 1000 mg twice daily Medications tried in the past: Jardiance and Farxiga (increased urination, causes him to feel dry)  Tolerating current medications well. Reports since his last visit with Dr. Allyne Gee he has been working on dietary improvements including carbohydrate portion control. Notes that BG was well controlled throughout the day, but would have BG 160-170 in the morning. Since cutting back on carbohydrate portions with dinner and increasing vegetable intake he has noticed improvement in BG as shown below.  Current glucose readings: FBG 93-110 Using glucometer; testing 2 times daily  Patient reports hypoglycemic s/sx including dizziness, shakiness, sweating. Checks BG when he has these feelings and BG is 70-80s. Patient reports hyperglycemic symptoms including  headache, blurry vision, fatigue when BG is 180-200. Notes this only happens occasionally when he eats something he knows he shouldn't (less than once a week).  Current meal patterns:  - 3 meals/day - Breakfast: waffles, eggs,  pancakes with blueberries - Lunch: salmon, rice, chicken sandwich with veggies, chicken patties - Supper: rice, vegetables, chicken, fish - Snacks: nuts, banana - Drinks: water  Current physical activity: walks 12,000 steps/day, weight machines almost every day   Hypertension:  Current medications: amlodipine 10 mg daily, olmesartan-hydrochlorothiazide 40-25 mg daily  Patient has a validated, automated, upper arm home BP cuff Current blood pressure readings readings: 120/89, 116/80   Hyperlipidemia/ASCVD Risk Reduction  Current lipid lowering medications: rosuvastatin 20 mg daily  Antiplatelet regimen: ASA 81 mg daily   Objective:  Lab Results  Component Value Date   HGBA1C 7.7 (H) 09/14/2022    Lab Results  Component Value Date   CREATININE 0.80 09/14/2022   BUN 13 09/14/2022   NA 139 09/14/2022   K 3.9 09/14/2022   CL 98 09/14/2022   CO2 25 09/14/2022    Lab Results  Component Value Date   CHOL 129 09/14/2022   HDL 49 09/14/2022   LDLCALC 63 09/14/2022   TRIG 86 09/14/2022   CHOLHDL 2.6 09/14/2022    Medications Reviewed Today   Medications were not reviewed in this encounter       Assessment/Plan:   Diabetes: - Currently uncontrolled based on last A1c 7.7%, but improving based on home FBG within goal. - Reviewed long term cardiovascular and renal outcomes of uncontrolled blood sugar - Reviewed goal A1c, goal fasting, and goal 2 hour post prandial glucose - Extensively reviewed dietary modifications including appropriate portions of carbohydrates and my plate method.  Discussed cutting back on pancakes and waffles for breakfast with lower carb substitutions such as greek yogurt with protein and no added sugar. Discussed  increasing protein intake to maintain satiety and minimize spikes in blood sugar. - Recommend to increase Ozempic to 2 mg weekly and work on dietary modifications. Per pt preference will not add on new medications today. - Discussed referral to endocrinology as previously talked about with Dr. Allyne Gee. Patient reported he is willing to see endocrinology if A1c is still elevated after dietary improvements and increasing Ozempic.  - Recommend to check glucose once daily in the morning and 2 hours after meals   Hypertension: - Currently controlled - Recommend to continue current regimen.    Hyperlipidemia/ASCVD Risk Reduction: - Currently controlled.  - Recommend to continue current regimen.     Follow Up Plan: telephone visit in 1 month  Adam Phenix, PharmD PGY-1 Pharmacy Resident

## 2022-10-03 ENCOUNTER — Encounter: Payer: Self-pay | Admitting: Internal Medicine

## 2022-10-03 ENCOUNTER — Other Ambulatory Visit (HOSPITAL_COMMUNITY): Payer: Self-pay

## 2022-10-05 ENCOUNTER — Other Ambulatory Visit (HOSPITAL_COMMUNITY): Payer: Self-pay

## 2022-10-05 ENCOUNTER — Other Ambulatory Visit: Payer: Self-pay | Admitting: Internal Medicine

## 2022-10-06 ENCOUNTER — Other Ambulatory Visit (HOSPITAL_COMMUNITY): Payer: Self-pay

## 2022-10-06 MED ORDER — FREESTYLE LITE TEST VI STRP
ORAL_STRIP | Freq: Two times a day (BID) | 2 refills | Status: DC
Start: 2022-10-06 — End: 2023-01-25
  Filled 2022-10-06: qty 150, fill #0
  Filled 2022-10-07: qty 150, 75d supply, fill #0
  Filled 2022-12-27: qty 100, 50d supply, fill #1

## 2022-10-07 ENCOUNTER — Other Ambulatory Visit (HOSPITAL_COMMUNITY): Payer: Self-pay

## 2022-11-01 ENCOUNTER — Other Ambulatory Visit: Payer: Commercial Managed Care - PPO

## 2022-11-01 NOTE — Progress Notes (Signed)
11/01/2022 Name: Phillip Armstrong MRN: [id number] DOB: April 07, 1965  No chief complaint on file.   Phillip Armstrong is a 57 y.o. year old male who presented for a telephone visit.   They were referred to the pharmacist by their PCP for assistance in managing diabetes.    Subjective:  Care Team: Primary Care Provider: Dorothyann Peng, MD ; Next Scheduled Visit: 01/25/23  Medication Access/Adherence  Current Pharmacy:  [facility] [address] [place] [address] Phone: [phone] Fax: [phone]  [facility] [address] [place] [address] Phone: [phone] Fax: [phone]  [id number] [place], [place] - [address] AT [facility] [address] [place] [address] Phone: [phone] Fax: [phone]   Patient reports affordability concerns with their medications: No  Patient reports access/transportation concerns to their pharmacy: No  Patient reports adherence concerns with their medications:  No     Diabetes:  Current medications: Ozempic 1 mg weekly, pioglitazone 15 mg daily, metformin 1000 mg twice daily  Medications tried in the past: [place] and Farxiga (increased urination, causes him to feel dry)   Current glucose readings: FBG 86, 98, 116, 118 and in the evening 116, 120, 160, 170 Using glucometer; testing twice daily (first thing in the morning and in the evening before or after dinner)  Patient denies hypoglycemic s/sx including dizziness, shakiness, sweating. Patient denies hyperglycemic symptoms including polyuria, polydipsia, polyphagia, nocturia, neuropathy, blurred vision.  Current meal patterns:  - 3 meals/day - Breakfast: waffles, eggs, pancakes with blueberries, but has cut back on portion size in the past month - Lunch: salmon, rice, chicken sandwich with veggies, chicken patties  - Supper: rice, vegetables, chicken, fish, [place]  tacos - Snacks: nuts, apple - Drinks: water  Current physical activity: walks 12,000 steps/day, weight machines at work every day    Objective:  Lab Results  Component Value Date   HGBA1C 7.7 (H) 09/14/2022    Lab Results  Component Value Date   CREATININE 0.80 09/14/2022   BUN 13 09/14/2022   NA 139 09/14/2022   K 3.9 09/14/2022   CL 98 09/14/2022   CO2 25 09/14/2022    Lab Results  Component Value Date   CHOL 129 09/14/2022   HDL 49 09/14/2022   LDLCALC 63 09/14/2022   TRIG 86 09/14/2022   CHOLHDL 2.6 09/14/2022    Medications Reviewed Today   Medications were not reviewed in this encounter       Assessment/Plan:   Diabetes: - Currently uncontrolled based on last A1c 7.7%, but improving based on patient reported BG - Reviewed long term cardiovascular and renal outcomes of uncontrolled blood sugar - Reviewed goal A1c, goal fasting, and goal 2 hour post prandial glucose - Reviewed dietary modifications including limiting carbohydrates, incorporating protein with every meal, increasing non-starchy vegetables. - Reviewed lifestyle modifications including: encouraged patient to continue walking every day and completing weight exercises - Patient declines to make any medications at this time. Discussed increasing Ozempic again and explained that increasing Ozempic from 1 mg to 2 mg does not necessarily cause the same degree of weight loss as increasing at the lower doses. Patient preferred to continue working on dietary changes and wait till A1c is checked again to determine need for medication changes. Per patient preference, will continue current regimen. - Discussed referral to endocrinologist as previously recommended by Dr. Allyne Gee and he stated he would reconsider seeing an endocrinologist  if his A1c is elevated on next check - Recommend to check glucose once daily in the morning and 2 hours after meals  - A1c due 12/15/22    Follow Up Plan:  Will collaborate  with CPP to schedule in person f/u to have A1c checked around the end of November PCP visit in January   Jarrett Ables, PharmD PGY-1 Pharmacy Resident

## 2022-11-16 ENCOUNTER — Ambulatory Visit: Payer: Commercial Managed Care - PPO | Admitting: Internal Medicine

## 2022-12-14 ENCOUNTER — Other Ambulatory Visit: Payer: Self-pay | Admitting: Internal Medicine

## 2022-12-14 ENCOUNTER — Other Ambulatory Visit (HOSPITAL_COMMUNITY): Payer: Self-pay

## 2022-12-14 ENCOUNTER — Other Ambulatory Visit: Payer: Self-pay

## 2022-12-14 DIAGNOSIS — I10 Essential (primary) hypertension: Secondary | ICD-10-CM

## 2022-12-14 MED ORDER — MELOXICAM 15 MG PO TABS
15.0000 mg | ORAL_TABLET | Freq: Every day | ORAL | 1 refills | Status: DC | PRN
  Filled 2022-12-14: qty 30, 30d supply, fill #0
  Filled 2023-02-15: qty 30, 30d supply, fill #1

## 2022-12-14 MED ORDER — AMLODIPINE BESYLATE 10 MG PO TABS
10.0000 mg | ORAL_TABLET | Freq: Every day | ORAL | 1 refills | Status: DC
Start: 2022-12-14 — End: 2023-01-25
  Filled 2022-12-14: qty 90, 90d supply, fill #0

## 2022-12-27 ENCOUNTER — Other Ambulatory Visit (HOSPITAL_COMMUNITY): Payer: Self-pay

## 2022-12-27 ENCOUNTER — Other Ambulatory Visit: Payer: Self-pay

## 2022-12-27 DIAGNOSIS — H5203 Hypermetropia, bilateral: Secondary | ICD-10-CM | POA: Diagnosis not present

## 2022-12-27 DIAGNOSIS — H524 Presbyopia: Secondary | ICD-10-CM | POA: Diagnosis not present

## 2022-12-27 LAB — HM DIABETES EYE EXAM

## 2022-12-28 ENCOUNTER — Other Ambulatory Visit (HOSPITAL_COMMUNITY): Payer: Self-pay

## 2022-12-29 ENCOUNTER — Other Ambulatory Visit (HOSPITAL_COMMUNITY): Payer: Self-pay

## 2023-01-25 ENCOUNTER — Encounter: Payer: Self-pay | Admitting: Internal Medicine

## 2023-01-25 ENCOUNTER — Ambulatory Visit (INDEPENDENT_AMBULATORY_CARE_PROVIDER_SITE_OTHER): Payer: Commercial Managed Care - PPO | Admitting: Internal Medicine

## 2023-01-25 ENCOUNTER — Other Ambulatory Visit (HOSPITAL_COMMUNITY): Payer: Self-pay

## 2023-01-25 VITALS — BP 134/88 | HR 88 | Temp 98.1°F | Ht 64.0 in | Wt 155.2 lb

## 2023-01-25 DIAGNOSIS — E785 Hyperlipidemia, unspecified: Secondary | ICD-10-CM

## 2023-01-25 DIAGNOSIS — Z0000 Encounter for general adult medical examination without abnormal findings: Secondary | ICD-10-CM | POA: Diagnosis not present

## 2023-01-25 DIAGNOSIS — E1169 Type 2 diabetes mellitus with other specified complication: Secondary | ICD-10-CM | POA: Diagnosis not present

## 2023-01-25 DIAGNOSIS — I10 Essential (primary) hypertension: Secondary | ICD-10-CM | POA: Diagnosis not present

## 2023-01-25 LAB — POCT URINALYSIS DIPSTICK
Bilirubin, UA: NEGATIVE
Blood, UA: NEGATIVE
Glucose, UA: POSITIVE — AB
Ketones, UA: NEGATIVE
Leukocytes, UA: NEGATIVE
Nitrite, UA: NEGATIVE
Protein, UA: NEGATIVE
Spec Grav, UA: 1.015 (ref 1.010–1.025)
Urobilinogen, UA: 0.2 U/dL
pH, UA: 7.5 (ref 5.0–8.0)

## 2023-01-25 LAB — POC HEMOCCULT BLD/STL (OFFICE/1-CARD/DIAGNOSTIC): Fecal Occult Blood, POC: NEGATIVE

## 2023-01-25 MED ORDER — FREESTYLE LITE TEST VI STRP
ORAL_STRIP | Freq: Two times a day (BID) | 2 refills | Status: DC
  Filled 2023-01-25: qty 200, fill #0
  Filled 2023-02-10: qty 150, 75d supply, fill #0
  Filled 2023-05-19: qty 200, 90d supply, fill #1
  Filled 2023-10-02: qty 100, 50d supply, fill #2

## 2023-01-25 MED ORDER — AMLODIPINE BESYLATE 10 MG PO TABS
10.0000 mg | ORAL_TABLET | Freq: Every day | ORAL | 1 refills | Status: DC
  Filled 2023-01-25 – 2023-04-04 (×2): qty 90, 90d supply, fill #0
  Filled 2023-07-17: qty 90, 90d supply, fill #1

## 2023-01-25 MED ORDER — METFORMIN HCL 1000 MG PO TABS
1000.0000 mg | ORAL_TABLET | Freq: Two times a day (BID) | ORAL | 1 refills | Status: DC
  Filled 2023-01-25 – 2023-04-04 (×2): qty 180, 90d supply, fill #0
  Filled 2023-06-23 (×2): qty 180, 90d supply, fill #1

## 2023-01-25 NOTE — Assessment & Plan Note (Signed)
 Chronic, uncontrolled. EKG performed, NSR w/o acute changes.  BP was controlled at previous visit, no med changes today.  He will continue with amlodipine  10mg  daily and olmesartan /hct 40/25mg  daily. Advised to follow low sodium diet. We will reassess at his next visit in four months.

## 2023-01-25 NOTE — Assessment & Plan Note (Signed)
 A full exam was performed.  DRE performed, stool is heme negative.  He is advised to get 30-45 minutes of regular exercise, no less than four to five days per week. Both weight-bearing and aerobic exercises are recommended.  He is advised to follow a healthy diet with at least six fruits/veggies per day, decrease intake of red meat and other saturated fats and to increase fish intake to twice weekly.  Meats/fish should not be fried -- baked, boiled or broiled is preferable. It is also important to cut back on your sugar intake.  Be sure to read labels - try to avoid anything with added sugar, high fructose corn syrup or other sweeteners.  If you must use a sweetener, you can try stevia or monkfruit.  It is also important to avoid artificially sweetened foods/beverages and diet drinks. Lastly, wear SPF 50 sunscreen on exposed skin and when in direct sunlight for an extended period of time.  Be sure to avoid fast food restaurants and aim for at least 60 ounces of water daily.

## 2023-01-25 NOTE — Progress Notes (Signed)
 I,[name], CMA,acting as a neurosurgeon for [name], MD.,have documented all relevant documentation on the behalf of [name], MD,as directed by  [name], MD while in the presence of [name], MD.  Subjective:   Patient ID: Phillip Armstrong , male    DOB: 1965/03/12 , 58 y.o.   MRN: [id number]  Chief Complaint  Patient presents with   Annual Exam   Diabetes   Hypertension   Hyperlipidemia    HPI  He is here today for a full physical examination. He has no specific concerns or complaints at this time. He reports compliance with meds. He denies having any headaches, chest pain and shortness of breath.   He states average BS is 97 in the mornings. He feels well, no new concerns.   Diabetes He presents for his follow-up diabetic visit. He has type 2 diabetes mellitus. There are no hypoglycemic associated symptoms. There are no diabetic associated symptoms. Pertinent negatives for diabetes include no blurred vision. There are no diabetic complications. An ACE inhibitor/angiotensin II receptor blocker is being taken. Eye exam is current.  Hypertension This is a chronic problem. The current episode started yesterday. The problem has been gradually improving since onset. The problem is controlled. Pertinent negatives include no blurred vision. Risk factors for coronary artery disease include dyslipidemia and diabetes mellitus. Past treatments include angiotensin blockers and diuretics. The current treatment provides moderate improvement.  Hyperlipidemia This is a chronic problem. The current episode started more than 1 year ago. The problem is controlled. Exacerbating diseases include diabetes. Current antihyperlipidemic treatment includes statins.     Past Medical History:  Diagnosis Date   Diabetes mellitus 2000   T2DM   Hyperlipidemia    Hypertension      Family History  Problem Relation Age of Onset   Diabetes Son        T1DM   Hypertension Other     Hypertension Father      Current Outpatient Medications:    aspirin 81 MG tablet, Take 81 mg by mouth daily., Disp: , Rfl:    Blood Glucose Monitoring Suppl (FREESTYLE LITE) DEVI, Use as directed to check blood sugars 2 times per day dx: e11.65, Disp: 1 each, Rfl: 1   Insulin  Pen Needle (UNIFINE PENTIPS) 32G X 4 MM MISC, USE AS DIRECTED, Disp: 100 each, Rfl: 11   Lancets (FREESTYLE) lancets, Use as instructed to check blood sugars 2 times daily., Disp: 150 each, Rfl: 2   meloxicam  (MOBIC ) 15 MG tablet, Take 1 tablet (15 mg total) by mouth daily as needed for knee pain., Disp: 30 tablet, Rfl: 1   Multiple Vitamins-Minerals (MULTIVITAMIN WITH MINERALS) tablet, Take 1 tablet by mouth daily., Disp: , Rfl:    olmesartan -hydrochlorothiazide  (BENICAR  HCT) 40-25 MG tablet, Take 1 tablet by mouth daily., Disp: 90 tablet, Rfl: 2   pioglitazone  (ACTOS ) 15 MG tablet, Take 1 tablet (15 mg total) by mouth daily., Disp: 90 tablet, Rfl: 2   rosuvastatin  (CRESTOR ) 20 MG tablet, Take 1 tablet (20 mg total) by mouth daily., Disp: 90 tablet, Rfl: 2   Semaglutide , 1 MG/DOSE, (OZEMPIC , 1 MG/DOSE,) 4 MG/3ML SOPN, Inject 1 mg into the skin once a week., Disp: 9 mL, Rfl: 2   amLODipine  (NORVASC ) 10 MG tablet, Take 1 tablet (10 mg total) by mouth daily., Disp: 90 tablet, Rfl: 1   glucose blood (FREESTYLE LITE) test strip, Use to check blood sugar 2 (two) times daily as directed, Disp: 150  each, Rfl: 2   metFORMIN  (GLUCOPHAGE ) 1000 MG tablet, Take 1 tablet (1,000 mg total) by mouth 2 (two) times daily with a meal., Disp: 180 tablet, Rfl: 1   No Known Allergies   Men's preventive visit. Patient Health Questionnaire (PHQ-2) is  Flowsheet Row Office Visit from 01/25/2023 in [facility]  PHQ-2 Total Score 0     . Patient is on a diabetic diet. Marital status: Married. Relevant history for alcohol use is:  Social History   Substance and Sexual Activity  Alcohol Use No  . Relevant  history for tobacco use is:  Social History   Tobacco Use  Smoking Status Never  Smokeless Tobacco Never  .   Review of Systems  Constitutional: Negative.   HENT: Negative.    Eyes: Negative.  Negative for blurred vision.  Respiratory: Negative.    Cardiovascular: Negative.   Gastrointestinal: Negative.   Endocrine: Negative.   Genitourinary: Negative.   Musculoskeletal: Negative.   Skin: Negative.   Allergic/Immunologic: Negative.   Neurological: Negative.   Hematological: Negative.      Today's Vitals   01/25/23 0829 01/25/23 0857  BP: (!) 142/90 134/88  Pulse: 88   Temp: 98.1 F (36.7 C)   SpO2: 98%   Weight: 155 lb 3.2 oz (70.4 kg)   Height: 5' 4 (1.626 m)    Body mass index is 26.64 kg/m.  Wt Readings from Last 3 Encounters:  01/25/23 155 lb 3.2 oz (70.4 kg)  09/14/22 154 lb (69.9 kg)  04/28/22 157 lb 6.4 oz (71.4 kg)   BP Readings from Last 3 Encounters:  01/25/23 134/88  09/14/22 110/80  04/28/22 128/70     Objective:  Physical Exam Vitals and nursing note reviewed.  Constitutional:      Appearance: Normal appearance.  HENT:     Head: Normocephalic and atraumatic.     Right Ear: Tympanic membrane, ear canal and external ear normal.     Left Ear: Tympanic membrane, ear canal and external ear normal.     Nose: Nose normal.     Mouth/Throat:     Mouth: Mucous membranes are moist.     Pharynx: Oropharynx is clear.  Eyes:     Extraocular Movements: Extraocular movements intact.     Conjunctiva/sclera: Conjunctivae normal.     Pupils: Pupils are equal, round, and reactive to light.  Cardiovascular:     Rate and Rhythm: Normal rate and regular rhythm.     Pulses: Normal pulses.     Heart sounds: Normal heart sounds.  Pulmonary:     Effort: Pulmonary effort is normal.     Breath sounds: Normal breath sounds.  Chest:  Breasts:    Right: Normal. No swelling, bleeding, inverted nipple, mass or nipple discharge.     Left: Normal. No swelling,  bleeding, inverted nipple, mass or nipple discharge.  Abdominal:     General: Abdomen is flat. Bowel sounds are normal.     Palpations: Abdomen is soft.  Genitourinary:    Prostate: Normal.     Rectum: Normal. Guaiac result negative.  Musculoskeletal:        General: Normal range of motion.     Cervical back: Normal range of motion and neck supple.  Skin:    General: Skin is warm.  Neurological:     General: No focal deficit present.     Mental Status: He is alert.  Psychiatric:        Mood and Affect: Mood  normal.        Behavior: Behavior normal.         Assessment And Plan:    Encounter for general adult medical examination w/o abnormal findings Assessment & Plan: A full exam was performed.  DRE performed, stool is heme negative.  He is advised to get 30-45 minutes of regular exercise, no less than four to five days per week. Both weight-bearing and aerobic exercises are recommended.  He is advised to follow a healthy diet with at least six fruits/veggies per day, decrease intake of red meat and other saturated fats and to increase fish intake to twice weekly.  Meats/fish should not be fried -- baked, boiled or broiled is preferable. It is also important to cut back on your sugar intake.  Be sure to read labels - try to avoid anything with added sugar, high fructose corn syrup or other sweeteners.  If you must use a sweetener, you can try stevia or monkfruit.  It is also important to avoid artificially sweetened foods/beverages and diet drinks. Lastly, wear SPF 50 sunscreen on exposed skin and when in direct sunlight for an extended period of time.  Be sure to avoid fast food restaurants and aim for at least 60 ounces of water daily.      Orders: -     CMP14+EGFR -     CBC -     PSA -     POC Hemoccult Bld/Stl (1-Cd Office Dx)  Dyslipidemia associated with type 2 diabetes mellitus (HCC) Assessment & Plan: Chronic, LDL goal is less than 70. He will continue with rosuvastatin   20mg  daily.  He does not wish to take Farxiga or Jardiance due to increased urination. He will continue with metformin  1000mg  twice daily, Ozempic  1mg  weekly and pioglitazone  15mg  daily. I have tried to wean him off of pioglitazone ; however, he prefers to stay on this medication. I DISCUSSED WITH THE PATIENT AT LENGTH REGARDING THE GOALS OF GLYCEMIC CONTROL AND POSSIBLE LONG-TERM COMPLICATIONS.  I  ALSO STRESSED THE IMPORTANCE OF COMPLIANCE WITH HOME GLUCOSE MONITORING, DIETARY RESTRICTIONS INCLUDING AVOIDANCE OF SUGARY DRINKS/PROCESSED FOODS,  ALONG WITH REGULAR EXERCISE.  I  ALSO STRESSED THE IMPORTANCE OF ANNUAL EYE EXAMS, SELF FOOT CARE AND COMPLIANCE WITH OFFICE VISITS.   Orders: -     POCT urinalysis dipstick -     Microalbumin / creatinine urine ratio -     EKG 12-Lead -     CMP14+EGFR -     Lipid panel -     Hemoglobin A1c -     CBC  Essential hypertension Assessment & Plan: Chronic, uncontrolled. EKG performed, NSR w/o acute changes.  BP was controlled at previous visit, no med changes today.  He will continue with amlodipine  10mg  daily and olmesartan /hct 40/25mg  daily. Advised to follow low sodium diet. We will reassess at his next visit in four months.   Orders: -     POCT urinalysis dipstick -     Microalbumin / creatinine urine ratio -     EKG 12-Lead -     amLODIPine  Besylate; Take 1 tablet (10 mg total) by mouth daily.  Dispense: 90 tablet; Refill: 1  Essential hypertension Assessment & Plan: Chronic, uncontrolled. EKG performed, NSR w/o acute changes.  BP was controlled at previous visit, no med changes today.  He will continue with amlodipine  10mg  daily and olmesartan /hct 40/25mg  daily. Advised to follow low sodium diet. We will reassess at his next visit in four months.   Orders: -  POCT urinalysis dipstick -     Microalbumin / creatinine urine ratio -     EKG 12-Lead -     amLODIPine  Besylate; Take 1 tablet (10 mg total) by mouth daily.  Dispense: 90 tablet;  Refill: 1  Other orders -     FreeStyle Lite Test; Use to check blood sugar 2 (two) times daily as directed  Dispense: 150 each; Refill: 2 -     metFORMIN  HCl; Take 1 tablet (1,000 mg total) by mouth 2 (two) times daily with a meal.  Dispense: 180 tablet; Refill: 1     Return for 1 year HM, 4 MONTH DM. Patient was given opportunity to ask questions. Patient verbalized understanding of the plan and was able to repeat key elements of the plan. All questions were answered to their satisfaction.     I, [name], MD, have reviewed all documentation for this visit. The documentation on 01/25/23 for the exam, diagnosis, procedures, and orders are all accurate and complete.

## 2023-01-25 NOTE — Patient Instructions (Signed)
 Health Maintenance, Male  Adopting a healthy lifestyle and getting preventive care are important in promoting health and wellness. Ask your health care provider about:  The right schedule for you to have regular tests and exams.  Things you can do on your own to prevent diseases and keep yourself healthy.  What should I know about diet, weight, and exercise?  Eat a healthy diet    Eat a diet that includes plenty of vegetables, fruits, low-fat dairy products, and lean protein.  Do not eat a lot of foods that are high in solid fats, added sugars, or sodium.  Maintain a healthy weight  Body mass index (BMI) is a measurement that can be used to identify possible weight problems. It estimates body fat based on height and weight. Your health care provider can help determine your BMI and help you achieve or maintain a healthy weight.  Get regular exercise  Get regular exercise. This is one of the most important things you can do for your health. Most adults should:  Exercise for at least 150 minutes each week. The exercise should increase your heart rate and make you sweat (moderate-intensity exercise).  Do strengthening exercises at least twice a week. This is in addition to the moderate-intensity exercise.  Spend less time sitting. Even light physical activity can be beneficial.  Watch cholesterol and blood lipids  Have your blood tested for lipids and cholesterol at 58 years of age, then have this test every 5 years.  You may need to have your cholesterol levels checked more often if:  Your lipid or cholesterol levels are high.  You are older than 58 years of age.  You are at high risk for heart disease.  What should I know about cancer screening?  Many types of cancers can be detected early and may often be prevented. Depending on your health history and family history, you may need to have cancer screening at various ages. This may include screening for:  Colorectal cancer.  Prostate cancer.  Skin cancer.  Lung  cancer.  What should I know about heart disease, diabetes, and high blood pressure?  Blood pressure and heart disease  High blood pressure causes heart disease and increases the risk of stroke. This is more likely to develop in people who have high blood pressure readings or are overweight.  Talk with your health care provider about your target blood pressure readings.  Have your blood pressure checked:  Every 3-5 years if you are 24-52 years of age.  Every year if you are 3 years old or older.  If you are between the ages of 60 and 72 and are a current or former smoker, ask your health care provider if you should have a one-time screening for abdominal aortic aneurysm (AAA).  Diabetes  Have regular diabetes screenings. This checks your fasting blood sugar level. Have the screening done:  Once every three years after age 66 if you are at a normal weight and have a low risk for diabetes.  More often and at a younger age if you are overweight or have a high risk for diabetes.  What should I know about preventing infection?  Hepatitis B  If you have a higher risk for hepatitis B, you should be screened for this virus. Talk with your health care provider to find out if you are at risk for hepatitis B infection.  Hepatitis C  Blood testing is recommended for:  Everyone born from [birth year] through 1965.  Anyone  with known risk factors for hepatitis C.  Sexually transmitted infections (STIs)  You should be screened each year for STIs, including gonorrhea and chlamydia, if:  You are sexually active and are younger than 58 years of age.  You are older than 58 years of age and your health care provider tells you that you are at risk for this type of infection.  Your sexual activity has changed since you were last screened, and you are at increased risk for chlamydia or gonorrhea. Ask your health care provider if you are at risk.  Ask your health care provider about whether you are at high risk for HIV. Your health care provider  may recommend a prescription medicine to help prevent HIV infection. If you choose to take medicine to prevent HIV, you should first get tested for HIV. You should then be tested every 3 months for as long as you are taking the medicine.  Follow these instructions at home:  Alcohol use  Do not drink alcohol if your health care provider tells you not to drink.  If you drink alcohol:  Limit how much you have to 0-2 drinks a day.  Know how much alcohol is in your drink. In the [place] one drink equals one 12 oz bottle of beer (355 mL), one 5 oz glass of wine (148 mL), or one 1 oz glass of hard liquor (44 mL).  Lifestyle  Do not use any products that contain nicotine or tobacco. These products include cigarettes, chewing tobacco, and vaping devices, such as e-cigarettes. If you need help quitting, ask your health care provider.  Do not use street drugs.  Do not share needles.  Ask your health care provider for help if you need support or information about quitting drugs.  General instructions  Schedule regular health, dental, and eye exams.  Stay current with your vaccines.  Tell your health care provider if:  You often feel depressed.  You have ever been abused or do not feel safe at home.  Summary  Adopting a healthy lifestyle and getting preventive care are important in promoting health and wellness.  Follow your health care provider's instructions about healthy diet, exercising, and getting tested or screened for diseases.  Follow your health care provider's instructions on monitoring your cholesterol and blood pressure.  This information is not intended to replace advice given to you by your health care provider. Make sure you discuss any questions you have with your health care provider.  Document Revised: 05/25/2020 Document Reviewed: 05/25/2020  Elsevier Patient Education  2024 [employer].

## 2023-01-25 NOTE — Assessment & Plan Note (Signed)
 Chronic, LDL goal is less than 70. He will continue with rosuvastatin  20mg  daily.  He does not wish to take Farxiga or Jardiance due to increased urination. He will continue with metformin  1000mg  twice daily, Ozempic  1mg  weekly and pioglitazone  15mg  daily. I have tried to wean him off of pioglitazone ; however, he prefers to stay on this medication. I DISCUSSED WITH THE PATIENT AT LENGTH REGARDING THE GOALS OF GLYCEMIC CONTROL AND POSSIBLE LONG-TERM COMPLICATIONS.  I  ALSO STRESSED THE IMPORTANCE OF COMPLIANCE WITH HOME GLUCOSE MONITORING, DIETARY RESTRICTIONS INCLUDING AVOIDANCE OF SUGARY DRINKS/PROCESSED FOODS,  ALONG WITH REGULAR EXERCISE.  I  ALSO STRESSED THE IMPORTANCE OF ANNUAL EYE EXAMS, SELF FOOT CARE AND COMPLIANCE WITH OFFICE VISITS.

## 2023-01-27 LAB — CMP14+EGFR
ALT: 16 [IU]/L (ref 0–44)
AST: 19 [IU]/L (ref 0–40)
Albumin: 4.7 g/dL (ref 3.8–4.9)
Alkaline Phosphatase: 82 [IU]/L (ref 44–121)
BUN/Creatinine Ratio: 17 (ref 9–20)
BUN: 14 mg/dL (ref 6–24)
Bilirubin Total: 0.7 mg/dL (ref 0.0–1.2)
CO2: 25 mmol/L (ref 20–29)
Calcium: 9.8 mg/dL (ref 8.7–10.2)
Chloride: 96 mmol/L (ref 96–106)
Creatinine, Ser: 0.84 mg/dL (ref 0.76–1.27)
Globulin, Total: 2.4 g/dL (ref 1.5–4.5)
Glucose: 143 mg/dL — ABNORMAL HIGH (ref 70–99)
Potassium: 3.7 mmol/L (ref 3.5–5.2)
Sodium: 139 mmol/L (ref 134–144)
Total Protein: 7.1 g/dL (ref 6.0–8.5)
eGFR: 102 mL/min/{1.73_m2} (ref >59)

## 2023-01-27 LAB — LIPID PANEL
Chol/HDL Ratio: 3 {ratio} (ref 0.0–5.0)
Cholesterol, Total: 135 mg/dL (ref 100–199)
HDL: 45 mg/dL (ref >39)
LDL Chol Calc (NIH): 77 mg/dL (ref 0–99)
Triglycerides: 65 mg/dL (ref 0–149)
VLDL Cholesterol Cal: 13 mg/dL (ref 5–40)

## 2023-01-27 LAB — CBC
Hematocrit: 44.2 % (ref 37.5–51.0)
Hemoglobin: 14.2 g/dL (ref 13.0–17.7)
MCH: 26.2 pg — ABNORMAL LOW (ref 26.6–33.0)
MCHC: 32.1 g/dL (ref 31.5–35.7)
MCV: 82 fL (ref 79–97)
Platelets: 285 10*3/uL (ref 150–450)
RBC: 5.42 x10E6/uL (ref 4.14–5.80)
RDW: 12.7 % (ref 11.6–15.4)
WBC: 4.6 10*3/uL (ref 3.4–10.8)

## 2023-01-27 LAB — MICROALBUMIN / CREATININE URINE RATIO
Creatinine, Urine: 42.6 mg/dL
Microalb/Creat Ratio: <7 mg/g{creat} (ref 0–29)
Microalbumin, Urine: <3 ug/mL

## 2023-01-27 LAB — HEMOGLOBIN A1C
Est. average glucose Bld gHb Est-mCnc: 183 mg/dL
Hgb A1c MFr Bld: 8 % — ABNORMAL HIGH (ref 4.8–5.6)

## 2023-01-27 LAB — PSA: Prostate Specific Ag, Serum: 0.2 ng/mL (ref 0.0–4.0)

## 2023-02-10 ENCOUNTER — Other Ambulatory Visit: Payer: Self-pay

## 2023-02-10 ENCOUNTER — Other Ambulatory Visit (HOSPITAL_COMMUNITY): Payer: Self-pay

## 2023-02-14 ENCOUNTER — Other Ambulatory Visit (HOSPITAL_COMMUNITY): Payer: Self-pay

## 2023-03-01 ENCOUNTER — Other Ambulatory Visit: Payer: Self-pay | Admitting: Internal Medicine

## 2023-03-01 ENCOUNTER — Other Ambulatory Visit (HOSPITAL_COMMUNITY): Payer: Self-pay

## 2023-03-01 DIAGNOSIS — I10 Essential (primary) hypertension: Secondary | ICD-10-CM

## 2023-03-01 MED ORDER — OLMESARTAN MEDOXOMIL-HCTZ 40-25 MG PO TABS
1.0000 | ORAL_TABLET | Freq: Every day | ORAL | 2 refills | Status: DC
  Filled 2023-03-01 (×2): qty 90, 90d supply, fill #0
  Filled 2023-05-19: qty 90, 90d supply, fill #1
  Filled 2023-08-23: qty 90, 90d supply, fill #2

## 2023-03-21 ENCOUNTER — Other Ambulatory Visit: Payer: Self-pay | Admitting: Internal Medicine

## 2023-03-21 DIAGNOSIS — E1169 Type 2 diabetes mellitus with other specified complication: Secondary | ICD-10-CM

## 2023-03-21 DIAGNOSIS — E7800 Pure hypercholesterolemia, unspecified: Secondary | ICD-10-CM

## 2023-03-22 ENCOUNTER — Other Ambulatory Visit (HOSPITAL_COMMUNITY): Payer: Self-pay

## 2023-03-22 MED ORDER — OZEMPIC (1 MG/DOSE) 4 MG/3ML ~~LOC~~ SOPN
1.0000 mg | PEN_INJECTOR | SUBCUTANEOUS | 2 refills | Status: DC
  Filled 2023-03-22: qty 9, 84d supply, fill #0
  Filled 2023-06-01: qty 9, 84d supply, fill #1
  Filled 2023-09-04 (×2): qty 9, 84d supply, fill #2

## 2023-03-22 MED ORDER — ROSUVASTATIN CALCIUM 20 MG PO TABS
20.0000 mg | ORAL_TABLET | Freq: Every day | ORAL | 2 refills | Status: DC
  Filled 2023-03-22: qty 90, 90d supply, fill #0
  Filled 2023-06-23 (×2): qty 90, 90d supply, fill #1
  Filled 2023-09-18: qty 90, 90d supply, fill #2

## 2023-04-04 ENCOUNTER — Other Ambulatory Visit (HOSPITAL_COMMUNITY): Payer: Self-pay

## 2023-04-04 ENCOUNTER — Other Ambulatory Visit: Payer: Self-pay

## 2023-04-04 ENCOUNTER — Other Ambulatory Visit: Payer: Self-pay | Admitting: Internal Medicine

## 2023-04-04 DIAGNOSIS — E1169 Type 2 diabetes mellitus with other specified complication: Secondary | ICD-10-CM

## 2023-04-04 MED ORDER — PIOGLITAZONE HCL 15 MG PO TABS
15.0000 mg | ORAL_TABLET | Freq: Every day | ORAL | 2 refills | Status: DC
  Filled 2023-04-04: qty 90, 90d supply, fill #0
  Filled 2023-06-23 (×2): qty 90, 90d supply, fill #1
  Filled 2023-10-02: qty 90, 90d supply, fill #2

## 2023-04-20 ENCOUNTER — Other Ambulatory Visit (HOSPITAL_COMMUNITY): Payer: Self-pay

## 2023-04-20 ENCOUNTER — Ambulatory Visit: Payer: Self-pay | Admitting: Internal Medicine

## 2023-04-20 ENCOUNTER — Encounter: Payer: Self-pay | Admitting: Internal Medicine

## 2023-04-20 ENCOUNTER — Other Ambulatory Visit: Payer: Self-pay | Admitting: Internal Medicine

## 2023-04-20 VITALS — BP 130/92 | HR 98 | Temp 98.3°F | Ht 64.0 in | Wt 157.6 lb

## 2023-04-20 DIAGNOSIS — E663 Overweight: Secondary | ICD-10-CM

## 2023-04-20 DIAGNOSIS — I10 Essential (primary) hypertension: Secondary | ICD-10-CM | POA: Diagnosis not present

## 2023-04-20 DIAGNOSIS — E785 Hyperlipidemia, unspecified: Secondary | ICD-10-CM | POA: Diagnosis not present

## 2023-04-20 DIAGNOSIS — E1169 Type 2 diabetes mellitus with other specified complication: Secondary | ICD-10-CM | POA: Diagnosis not present

## 2023-04-20 DIAGNOSIS — Z6827 Body mass index (BMI) 27.0-27.9, adult: Secondary | ICD-10-CM | POA: Diagnosis not present

## 2023-04-20 MED ORDER — SPIRONOLACTONE 25 MG PO TABS
12.5000 mg | ORAL_TABLET | Freq: Every day | ORAL | 2 refills | Status: DC
  Filled 2023-04-20: qty 45, 90d supply, fill #0
  Filled 2023-07-04: qty 45, 90d supply, fill #1
  Filled 2023-09-24: qty 45, 90d supply, fill #2

## 2023-04-20 MED ORDER — MELOXICAM 15 MG PO TABS
15.0000 mg | ORAL_TABLET | Freq: Every day | ORAL | 1 refills | Status: DC | PRN
  Filled 2023-04-20: qty 30, 30d supply, fill #0
  Filled 2023-06-29: qty 30, 30d supply, fill #1

## 2023-04-20 NOTE — Progress Notes (Signed)
 I,Victoria T Deloria Lair, CMA,acting as a Neurosurgeon for Gwynneth Aliment, MD.,have documented all relevant documentation on the behalf of Gwynneth Aliment, MD,as directed by  Gwynneth Aliment, MD while in the presence of Gwynneth Aliment, MD.  Subjective:  Patient ID: Phillip Armstrong , male    DOB: 1965-06-19 , 58 y.o.   MRN: [id number]  Chief Complaint  Patient presents with   Hypertension    Patient presents today for bpc. Initially noticed 5 days ago. He does not know what could be possibly causing this. He does keep track of his bp at home. Denies chest pain & sob. He does experience a headache sometimes.    Discussed the use of AI scribe software for clinical note transcription with the patient, who gave verbal consent to proceed.  History of Present Illness Phillip Armstrong is a 58 year old male with hypertension and diabetes who presents with elevated blood pressure readings.  He has elevated blood pressure readings at home, with measurements sometimes reaching 150/90 mmHg. No changes in diet are noted, and he confirms adherence to his medication regimen, which includes amlodipine taken in the evening and olmesartan/hctz in the morning. He notes a decrease in his exercise routine since starting a new job, which he believes may be contributing to the elevated readings.  Regarding his diabetes, he monitors his blood sugar levels twice daily, with morning readings fluctuating between 103 mg/dL and 782 mg/dL. He acknowledges that his blood sugar levels can vary depending on his diet, particularly what he eats before bed.   Hypertension This is a chronic problem. The current episode started yesterday. The problem has been gradually improving since onset. The problem is controlled. Pertinent negatives include no palpitations. Risk factors for coronary artery disease include dyslipidemia and diabetes mellitus. Past treatments include angiotensin blockers and diuretics. The current treatment provides  moderate improvement.     Past Medical History:  Diagnosis Date   Diabetes mellitus 2000   T2DM   Hyperlipidemia    Hypertension      Family History  Problem Relation Age of Onset   Diabetes Son        T1DM   Hypertension Other    Hypertension Father      Current Outpatient Medications:    amLODipine (NORVASC) 10 MG tablet, Take 1 tablet (10 mg total) by mouth daily., Disp: 90 tablet, Rfl: 1   aspirin 81 MG tablet, Take 81 mg by mouth daily., Disp: , Rfl:    Blood Glucose Monitoring Suppl (FREESTYLE LITE) DEVI, Use as directed to check blood sugars 2 times per day dx: e11.65, Disp: 1 each, Rfl: 1   glucose blood (FREESTYLE LITE) test strip, Use to check blood sugar 2 (two) times daily as directed, Disp: 150 each, Rfl: 2   Insulin Pen Needle (UNIFINE PENTIPS) 32G X 4 MM MISC, USE AS DIRECTED, Disp: 100 each, Rfl: 11   Lancets (FREESTYLE) lancets, Use as instructed to check blood sugars 2 times daily., Disp: 150 each, Rfl: 2   metFORMIN (GLUCOPHAGE) 1000 MG tablet, Take 1 tablet (1,000 mg total) by mouth 2 (two) times daily with a meal., Disp: 180 tablet, Rfl: 1   Multiple Vitamins-Minerals (MULTIVITAMIN WITH MINERALS) tablet, Take 1 tablet by mouth daily., Disp: , Rfl:    olmesartan-hydrochlorothiazide (BENICAR HCT) 40-25 MG tablet, Take 1 tablet by mouth daily., Disp: 90 tablet, Rfl: 2   pioglitazone (ACTOS) 15 MG tablet, Take 1 tablet (15 mg total) by mouth daily., Disp: 90 tablet,  Rfl: 2   rosuvastatin (CRESTOR) 20 MG tablet, Take 1 tablet (20 mg total) by mouth daily., Disp: 90 tablet, Rfl: 2   Semaglutide, 1 MG/DOSE, (OZEMPIC, 1 MG/DOSE,) 4 MG/3ML SOPN, Inject 1 mg into the skin once a week., Disp: 9 mL, Rfl: 2   spironolactone (ALDACTONE) 25 MG tablet, Take 1/2 tablet (12.5 mg total) by mouth daily., Disp: 45 tablet, Rfl: 2   meloxicam (MOBIC) 15 MG tablet, Take 1 tablet (15 mg total) by mouth daily as needed for knee pain., Disp: 30 tablet, Rfl: 1   No Known Allergies    Review of Systems  Constitutional: Negative.   HENT: Negative.    Respiratory: Negative.    Cardiovascular: Negative.  Negative for palpitations.  Gastrointestinal: Negative.   Skin: Negative.   Allergic/Immunologic: Negative.   Hematological: Negative.      Today's Vitals   04/20/23 1200 04/20/23 1220  BP: (!) 140/90 (!) 130/92  Pulse: 98   Temp: 98.3 F (36.8 C)   SpO2: 98%   Weight: 157 lb 9.6 oz (71.5 kg)   Height: 5\' 4"  (1.626 m)    Body mass index is 27.05 kg/m.  Wt Readings from Last 3 Encounters:  04/20/23 157 lb 9.6 oz (71.5 kg)  01/25/23 155 lb 3.2 oz (70.4 kg)  09/14/22 154 lb (69.9 kg)    BP Readings from Last 3 Encounters:  04/20/23 (!) 130/92  01/25/23 134/88  09/14/22 110/80     Objective:  Physical Exam Vitals and nursing note reviewed.  Constitutional:      Appearance: Normal appearance.  HENT:     Head: Normocephalic and atraumatic.  Eyes:     Extraocular Movements: Extraocular movements intact.  Cardiovascular:     Rate and Rhythm: Normal rate and regular rhythm.     Heart sounds: Normal heart sounds.  Pulmonary:     Effort: Pulmonary effort is normal.     Breath sounds: Normal breath sounds.  Musculoskeletal:     Cervical back: Normal range of motion.  Skin:    General: Skin is warm.  Neurological:     General: No focal deficit present.     Mental Status: He is alert.  Psychiatric:        Mood and Affect: Mood normal.         Assessment And Plan:  Essential hypertension Assessment & Plan: Blood pressure remains elevated despite triple therapy, suggesting resistant hypertension. Reduced exercise may contribute. -He will continue w/ amlodipine1 0mg  every day and olmesartan/hct 40/25mg  qd - Prescribe spironolactone 12.5 mg daily in the morning. - Schedule nurse visit in one week for blood pressure, potassium, and kidney function check. - Refer to hypertension clinic if uncontrolled.  Orders: -     CMP14+EGFR  Dyslipidemia  associated with type 2 diabetes mellitus (HCC) Assessment & Plan: Chronic, LDL goal is less than 70. He will continue with rosuvastatin 20mg  daily.  He does not wish to take [place] or Jardiance due to increased urination. He will continue with metformin 1000mg  twice daily, Ozempic 1mg  weekly and pioglitazone 15mg  daily. I have tried to wean him off of pioglitazone; however, he prefers to stay on this medication.Blood glucose levels fluctuate.  Advised reduced monitoring frequency to spare fingers. - Order A1c and diabetes labs at next nurse visit. - Check blood glucose twice daily, three days a week.    Orders: -     CMP14+EGFR -     BMP8+EGFR; Future -     Hemoglobin A1c;  Future  Overweight with body mass index (BMI) of 27 to 27.9 in adult Assessment & Plan: He is encouraged to aim for at least 150 minutes of exercise per week.    Other orders -     Spironolactone; Take 1/2 tablet (12.5 mg total) by mouth daily.  Dispense: 45 tablet; Refill: 2   Return in 8 days (on 04/28/2023), or [place] bp check AND Lab visit.  Patient was given opportunity to ask questions. Patient verbalized understanding of the plan and was able to repeat key elements of the plan. All questions were answered to their satisfaction.    I, Gwynneth Aliment, MD, have reviewed all documentation for this visit. The documentation on 04/20/23 for the exam, diagnosis, procedures, and orders are all accurate and complete.   IF YOU HAVE BEEN REFERRED TO A SPECIALIST, IT MAY TAKE 1-2 WEEKS TO SCHEDULE/PROCESS THE REFERRAL. IF YOU HAVE NOT HEARD FROM US/SPECIALIST IN TWO WEEKS, PLEASE GIVE [place] A CALL AT [phone].   THE PATIENT IS ENCOURAGED TO PRACTICE SOCIAL DISTANCING DUE TO THE COVID-19 PANDEMIC.

## 2023-04-20 NOTE — Patient Instructions (Signed)
 Hypertension, Adult Hypertension is another name for high blood pressure. High blood pressure forces your heart to work harder to pump blood. This can cause problems over time. There are two numbers in a blood pressure reading. There is a top number (systolic) over a bottom number (diastolic). It is best to have a blood pressure that is below 120/80. What are the causes? The cause of this condition is not known. Some other conditions can lead to high blood pressure. What increases the risk? Some lifestyle factors can make you more likely to develop high blood pressure: Smoking. Not getting enough exercise or physical activity. Being overweight. Having too much fat, sugar, calories, or salt (sodium) in your diet. Drinking too much alcohol. Other risk factors include: Having any of these conditions: Heart disease. Diabetes. High cholesterol. Kidney disease. Obstructive sleep apnea. Having a family history of high blood pressure and high cholesterol. Age. The risk increases with age. Stress. What are the signs or symptoms? High blood pressure may not cause symptoms. Very high blood pressure (hypertensive crisis) may cause: Headache. Fast or uneven heartbeats (palpitations). Shortness of breath. Nosebleed. Vomiting or feeling like you may vomit (nauseous). Changes in how you see. Very bad chest pain. Feeling dizzy. Seizures. How is this treated? This condition is treated by making healthy lifestyle changes, such as: Eating healthy foods. Exercising more. Drinking less alcohol. Your doctor may prescribe medicine if lifestyle changes do not help enough and if: Your top number is above 130. Your bottom number is above 80. Your personal target blood pressure may vary. Follow these instructions at home: Eating and drinking  If told, follow the DASH eating plan. To follow this plan: Fill one half of your plate at each meal with fruits and vegetables. Fill one fourth of your plate  at each meal with whole grains. Whole grains include whole-wheat pasta, brown rice, and whole-grain bread. Eat or drink low-fat dairy products, such as skim milk or low-fat yogurt. Fill one fourth of your plate at each meal with low-fat (lean) proteins. Low-fat proteins include fish, chicken without skin, eggs, beans, and tofu. Avoid fatty meat, cured and processed meat, or chicken with skin. Avoid pre-made or processed food. Limit the amount of salt in your diet to less than 1,500 mg each day. Do not drink alcohol if: Your doctor tells you not to drink. You are pregnant, may be pregnant, or are planning to become pregnant. If you drink alcohol: Limit how much you have to: 0-1 drink a day for women. 0-2 drinks a day for men. Know how much alcohol is in your drink. In the [place] one drink equals one 12 oz bottle of beer (355 mL), one 5 oz glass of wine (148 mL), or one 1 oz glass of hard liquor (44 mL). Lifestyle  Work with your doctor to stay at a healthy weight or to lose weight. Ask your doctor what the best weight is for you. Get at least 30 minutes of exercise that causes your heart to beat faster (aerobic exercise) most days of the week. This may include walking, swimming, or biking. Get at least 30 minutes of exercise that strengthens your muscles (resistance exercise) at least 3 days a week. This may include lifting weights or doing Pilates. Do not smoke or use any products that contain nicotine or tobacco. If you need help quitting, ask your doctor. Check your blood pressure at home as told by your doctor. Keep all follow-up visits. Medicines Take over-the-counter and prescription medicines  only as told by your doctor. Follow directions carefully. Do not skip doses of blood pressure medicine. The medicine does not work as well if you skip doses. Skipping doses also puts you at risk for problems. Ask your doctor about side effects or reactions to medicines that you should watch  for. Contact a doctor if: You think you are having a reaction to the medicine you are taking. You have headaches that keep coming back. You feel dizzy. You have swelling in your ankles. You have trouble with your vision. Get help right away if: You get a very bad headache. You start to feel mixed up (confused). You feel weak or numb. You feel faint. You have very bad pain in your: Chest. Belly (abdomen). You vomit more than once. You have trouble breathing. These symptoms may be an emergency. Get help right away. Call 911. Do not wait to see if the symptoms will go away. Do not drive yourself to the hospital. Summary Hypertension is another name for high blood pressure. High blood pressure forces your heart to work harder to pump blood. For most people, a normal blood pressure is less than 120/80. Making healthy choices can help lower blood pressure. If your blood pressure does not get lower with healthy choices, you may need to take medicine. This information is not intended to replace advice given to you by your health care provider. Make sure you discuss any questions you have with your health care provider. Document Revised: 10/22/2020 Document Reviewed: 10/22/2020 Elsevier Patient Education  2024 [employer].

## 2023-04-21 LAB — CMP14+EGFR
ALT: 19 IU/L (ref 0–44)
AST: 23 IU/L (ref 0–40)
Albumin: 4.8 g/dL (ref 3.8–4.9)
Alkaline Phosphatase: 89 IU/L (ref 44–121)
BUN/Creatinine Ratio: 13 (ref 9–20)
BUN: 12 mg/dL (ref 6–24)
Bilirubin Total: 0.6 mg/dL (ref 0.0–1.2)
CO2: 24 mmol/L (ref 20–29)
Calcium: 10.3 mg/dL — ABNORMAL HIGH (ref 8.7–10.2)
Chloride: 97 mmol/L (ref 96–106)
Creatinine, Ser: 0.94 mg/dL (ref 0.76–1.27)
Globulin, Total: 2.5 g/dL (ref 1.5–4.5)
Glucose: 98 mg/dL (ref 70–99)
Potassium: 4.1 mmol/L (ref 3.5–5.2)
Sodium: 140 mmol/L (ref 134–144)
Total Protein: 7.3 g/dL (ref 6.0–8.5)
eGFR: 95 mL/min/{1.73_m2} (ref >59)

## 2023-04-22 ENCOUNTER — Encounter: Payer: Self-pay | Admitting: Internal Medicine

## 2023-04-22 ENCOUNTER — Other Ambulatory Visit: Payer: Self-pay | Admitting: Internal Medicine

## 2023-04-22 DIAGNOSIS — E1169 Type 2 diabetes mellitus with other specified complication: Secondary | ICD-10-CM

## 2023-04-23 NOTE — Assessment & Plan Note (Signed)
He is encouraged to aim for at least 150 minutes of exercise per week.

## 2023-04-23 NOTE — Assessment & Plan Note (Signed)
 Blood pressure remains elevated despite triple therapy, suggesting resistant hypertension. Reduced exercise may contribute. -He will continue w/ amlodipine1 0mg  every day and olmesartan/hct 40/25mg  qd - Prescribe spironolactone 12.5 mg daily in the morning. - Schedule nurse visit in one week for blood pressure, potassium, and kidney function check. - Refer to hypertension clinic if uncontrolled.

## 2023-04-23 NOTE — Assessment & Plan Note (Signed)
 Chronic, LDL goal is less than 70. He will continue with rosuvastatin 20mg  daily.  He does not wish to take [place] or Jardiance due to increased urination. He will continue with metformin 1000mg  twice daily, Ozempic 1mg  weekly and pioglitazone 15mg  daily. I have tried to wean him off of pioglitazone; however, he prefers to stay on this medication.Blood glucose levels fluctuate.  Advised reduced monitoring frequency to spare fingers. - Order A1c and diabetes labs at next nurse visit. - Check blood glucose twice daily, three days a week.

## 2023-04-28 ENCOUNTER — Other Ambulatory Visit

## 2023-04-28 ENCOUNTER — Ambulatory Visit

## 2023-04-28 VITALS — BP 132/80 | HR 75 | Temp 98.1°F | Ht 64.0 in | Wt 157.0 lb

## 2023-04-28 DIAGNOSIS — E785 Hyperlipidemia, unspecified: Secondary | ICD-10-CM | POA: Diagnosis not present

## 2023-04-28 DIAGNOSIS — E1169 Type 2 diabetes mellitus with other specified complication: Secondary | ICD-10-CM | POA: Diagnosis not present

## 2023-04-28 DIAGNOSIS — I10 Essential (primary) hypertension: Secondary | ICD-10-CM

## 2023-04-28 NOTE — Progress Notes (Signed)
 Patient presents today for bpc & lab visit. He currently takes Amlodipine 10MG  at night , Olmesartan hydrochlorothiazide 40-25 in the morning & spirolactone which was recently added at most recent visit he takes in the morning. Denies headache, chest pain & sob.  BP Readings from Last 3 Encounters:  04/28/23 132/80  04/20/23 (!) 130/92  01/25/23 134/88  Per provider patient is to continue with current regimen. He is scheduled to come back in 3 months. Patient aware. Appointment scheduled.

## 2023-04-29 LAB — BMP8+EGFR
BUN/Creatinine Ratio: 21 — ABNORMAL HIGH (ref 9–20)
BUN: 17 mg/dL (ref 6–24)
CO2: 24 mmol/L (ref 20–29)
Calcium: 9.7 mg/dL (ref 8.7–10.2)
Chloride: 95 mmol/L — ABNORMAL LOW (ref 96–106)
Creatinine, Ser: 0.82 mg/dL (ref 0.76–1.27)
Glucose: 263 mg/dL — ABNORMAL HIGH (ref 70–99)
Potassium: 4.2 mmol/L (ref 3.5–5.2)
Sodium: 137 mmol/L (ref 134–144)
eGFR: 102 mL/min/{1.73_m2} (ref >59)

## 2023-04-29 LAB — HEMOGLOBIN A1C
Est. average glucose Bld gHb Est-mCnc: 186 mg/dL
Hgb A1c MFr Bld: 8.1 % — ABNORMAL HIGH (ref 4.8–5.6)

## 2023-05-01 ENCOUNTER — Encounter: Payer: Self-pay | Admitting: Internal Medicine

## 2023-05-19 ENCOUNTER — Other Ambulatory Visit (HOSPITAL_COMMUNITY): Payer: Self-pay

## 2023-05-19 ENCOUNTER — Other Ambulatory Visit: Payer: Self-pay

## 2023-05-25 ENCOUNTER — Ambulatory Visit: Payer: Commercial Managed Care - PPO | Admitting: Internal Medicine

## 2023-06-01 ENCOUNTER — Other Ambulatory Visit (HOSPITAL_COMMUNITY): Payer: Self-pay

## 2023-06-05 ENCOUNTER — Other Ambulatory Visit (HOSPITAL_COMMUNITY): Payer: Self-pay

## 2023-06-23 ENCOUNTER — Other Ambulatory Visit: Payer: Self-pay

## 2023-06-23 ENCOUNTER — Other Ambulatory Visit (HOSPITAL_COMMUNITY): Payer: Self-pay

## 2023-07-04 ENCOUNTER — Other Ambulatory Visit (HOSPITAL_COMMUNITY): Payer: Self-pay

## 2023-07-26 ENCOUNTER — Other Ambulatory Visit (HOSPITAL_COMMUNITY): Payer: Self-pay

## 2023-08-10 ENCOUNTER — Ambulatory Visit: Admitting: Internal Medicine

## 2023-08-22 ENCOUNTER — Telehealth: Payer: Self-pay | Admitting: Pharmacy Technician

## 2023-08-22 NOTE — Progress Notes (Addendum)
 08/22/2023 Name: Phillip Armstrong MRN: [id number] DOB: 30-Apr-1965  Patient is appearing on a report for [facility] [place] [facility] and last engaged with the clinical pharmacist to discuss diabetes on 11/01/2022. Contacted patient today to discuss diabetes management and completed medication review.   Diabetes Plan from last clinical pharmacist appointment:  Diabetes: - Currently uncontrolled based on last A1c 7.7%, but improving based on patient reported BG - Reviewed long term cardiovascular and renal outcomes of uncontrolled blood sugar - Reviewed goal A1c, goal fasting, and goal 2 hour post prandial glucose - Reviewed dietary modifications including limiting carbohydrates, incorporating protein with every meal, increasing non-starchy vegetables. - Reviewed lifestyle modifications including: encouraged patient to continue walking every day and completing weight exercises - Patient declines to make any medications at this time. Discussed increasing Ozempic  again and explained that increasing Ozempic  from 1 mg to 2 mg does not necessarily cause the same degree of weight loss as increasing at the lower doses. Patient preferred to continue working on dietary changes and wait till A1c is checked again to determine need for medication changes. Per patient preference, will continue current regimen. - Discussed referral to endocrinologist as previously recommended by Dr. Jarold and he stated he would reconsider seeing an endocrinologist if his A1c is elevated on next check - Recommend to check glucose once daily in the morning and 2 hours after meals  - A1c due 12/15/22 -Follow Up Plan:  Will collaborate with CPP to schedule in person f/u to have A1c checked around the end of November PCP visit in January(copy/paste from last note)   Medication Adherence Barriers Identified:  Patient made recommended medication changes per plan: Yes Patient has had an office visit with PCP since last Pharmacist  visit. Patient informs he takes Metformin  1000mg  twice a day, Pioglitazone  11m daily and Ozempic  1mg  weekly for diabetes. He also takes Amlodipine  10mg  daily, Olmesartan -HCT40/25mg  daily and Spironolactone  12.5mg  daily for blood pressure. Access issues with any new medication or testing device: No Patient reports no cost concerns for his medications. Patient informs he has aforementioned medications on hand to take as prescribed. Per Dr Annemarie data, Metformin  and Pioglitazone  were last filled for 90 day supply on 06/23/23, Ozempic  last filled for 84 day supply on 06/05/23. Olmesartan -HCT last filled for 90 day supply on 5/2, Amlodipine  on 6/30 for 90 day supply and Spironolactone  on 07/04/23 for 90 day supply.  Patient is checking blood sugars as prescribed: Yes Patient uses FreeStyle CGM. At the time of this call, he did not have his reader with him. From memory, patient informs his blood sugar fluctuates based on what he eats. He recalls his readings range from 95-190 but typically stay in the 96-120 range.  Patient informs he is checking his blood pressure. He informs his last 2 readings were 120/86 and 121/95 much better than what they had been . Patient is on a statin and per Dr Annemarie, Rosuvastatin  20mg  was last filled for 90 day supply on 06/23/23. Patient's last A1c was 8.1 on 04/28/23. Patient has PCP appointment scheduled for 09/06/2023.  Medication Adherence Barriers Addressed/Actions Taken:  Reviewed medication changes per plan from last clinical pharmacist note Educated patient to contact pharmacy regarding new prescriptions and refills.  Reviewed instructions for monitoring blood sugars at home and reminded patient to keep a written log to review with pharmacist Reminded patient of date/time of upcoming clinical pharmacist follow up and any upcoming PCP/specialists visits. Patient denies transportation barriers to the appointment. Yes  Next  clinical pharmacist appointment is scheduled for:  TBD  Kate Caddy, CPhT [facility] Office: [phone] Email: [email or web address]

## 2023-08-23 ENCOUNTER — Other Ambulatory Visit: Payer: Self-pay | Admitting: Internal Medicine

## 2023-08-24 ENCOUNTER — Other Ambulatory Visit: Payer: Self-pay

## 2023-08-24 ENCOUNTER — Ambulatory Visit: Admitting: Internal Medicine

## 2023-08-24 ENCOUNTER — Other Ambulatory Visit (HOSPITAL_COMMUNITY): Payer: Self-pay

## 2023-08-24 MED ORDER — MELOXICAM 15 MG PO TABS
15.0000 mg | ORAL_TABLET | Freq: Every day | ORAL | 1 refills | Status: DC | PRN
  Filled 2023-08-24: qty 30, 30d supply, fill #0
  Filled 2023-12-15: qty 30, 30d supply, fill #1

## 2023-08-28 ENCOUNTER — Telehealth: Payer: Self-pay | Admitting: Pharmacist

## 2023-08-28 DIAGNOSIS — E1165 Type 2 diabetes mellitus with hyperglycemia: Secondary | ICD-10-CM

## 2023-08-28 NOTE — Progress Notes (Signed)
 08/28/2023 Name: Phillip Armstrong MRN: [id number] DOB: Jan 15, 1966  Chief Complaint  Patient presents with   Medication Management    Diabetes     Dreden Rivere is a 58 y.o. year old male who presented for a telephone visit.   They were referred to the pharmacist by a quality report for assistance in managing diabetes.  (True [place] Metric Report)   Subjective:  [name] is a 58 year old male with type 2 diabetes, hyperlipidemia, and hypertension.  Care Team: Primary Care Provider: Jarold Medici, MD ; Next Scheduled Visit: 09/06/23  Medication Access/Adherence  Current Pharmacy:  JOLYNN PACK - [facility] [address], [address] [place] [address] Phone: [phone] Fax: [phone]  DARRYLE LONG - [facility] [address] [place] [address] Phone: [phone] Fax: [phone]  [id number] [place], [place] - [address] AT [facility] [address] [place] [address] Phone: [phone] Fax: [phone]   Patient reports affordability concerns with their medications: No  Patient reports access/transportation concerns to their pharmacy: No  Patient reports adherence concerns with their medications:  No      Diabetes: Ozempic  1 mg weekly Pioglitazone  15 mg 1 tablet daily Metformin  1000 mg 1 tablet twice daily  Current glucose readings: Uses Dexcom G7 but he is not linked to the [facility] clarity portal    Patient denies hypoglycemic s/sx including  dizziness, shakiness, sweating. Patient denies hyperglycemic symptoms including  polyuria, polydipsia, polyphagia, nocturia, neuropathy, blurred vision.  Hypertension:  Current medications:   Olmesartan -Hydrochlorothiazide  40-25-08/07/25 #90 Spironolactone  25 mg 1 tablet daily lf 07/04/23 #90 Amlodipine  10 mg 1 tablet daily lf 07/18/23 #90  Hyperlipidemia/ASCVD Risk Reduction  Current lipid lowering medications:  Rosuvastatin   20 mg 1 tablet daily   Antiplatelet regimen:   Aspirin 81 mg 1 tablet daily  Clinical ASCVD The 10-year ASCVD risk score (Arnett DK, et al., 2019) is: 21%   Values used to calculate the score:     Age: 16 years     Clincally relevant sex: Male     Is Non-Hispanic African American: Yes     Diabetic: Yes     Tobacco smoker: No     Systolic Blood Pressure: 132 mmHg     Is BP treated: Yes     HDL Cholesterol: 45 mg/dL     Total Cholesterol: 135 mg/dL     Objective:  Lab Results  Component Value Date   HGBA1C 8.1 (H) 04/28/2023    Lab Results  Component Value Date   CREATININE 0.82 04/28/2023   BUN 17 04/28/2023   NA 137 04/28/2023   K 4.2 04/28/2023   CL 95 (L) 04/28/2023   CO2 24 04/28/2023    Lab Results  Component Value Date   CHOL 135 01/25/2023   HDL 45 01/25/2023   LDLCALC 77 01/25/2023   TRIG 65 01/25/2023   CHOLHDL 3.0 01/25/2023    Medications Reviewed Today     Reviewed by Jolee Cassius PARAS, RPH (Pharmacist) on 08/28/23 at 1543  Med List Status: <None>   Medication Order Taking? Sig Documenting Provider Last Dose Status Informant  amLODipine  (NORVASC ) 10 MG tablet [id number] Yes Take 1 tablet (10 mg total) by mouth daily. Jarold Medici, MD  Active   aspirin 81 MG tablet [id number] Yes Take 81 mg by mouth daily. [provider]  Active Self           Med Note BONNEY, COURTNEY L  Mon Dec 17, 2012  6:59 PM)      Blood Glucose Monitoring Suppl (FREESTYLE LITE) DEVI [id number] Yes Use as directed to check blood sugars 2 times per day dx: e11.65 Jarold Medici, MD  Active   glucose blood (FREESTYLE LITE) test strip [id number] Yes Use to check blood sugar 2 (two) times daily as directed Jarold Medici, MD  Active   Lancets (FREESTYLE) lancets [id number] Yes Use as instructed to check blood sugars 2 times daily. Jarold Medici, MD  Active   meloxicam  (MOBIC ) 15 MG tablet [id number] Yes Take 1 tablet (15 mg total) by mouth daily as needed for knee pain.  Jarold Medici, MD  Active   metFORMIN  (GLUCOPHAGE ) 1000 MG tablet [id number] Yes Take 1 tablet (1,000 mg total) by mouth 2 (two) times daily with a meal. Jarold Medici, MD  Active   Multiple Vitamins-Minerals (MULTIVITAMIN WITH MINERALS) tablet [id number] Yes Take 1 tablet by mouth daily. [provider]  Active Self  olmesartan -hydrochlorothiazide  (BENICAR  HCT) 40-25 MG tablet [id number] Yes Take 1 tablet by mouth daily. Jarold Medici, MD  Active   pioglitazone  (ACTOS ) 15 MG tablet [id number] Yes Take 1 tablet (15 mg total) by mouth daily. Jarold Medici, MD  Active   rosuvastatin  (CRESTOR ) 20 MG tablet [id number] Yes Take 1 tablet (20 mg total) by mouth daily. Jarold Medici, MD  Active   Semaglutide , 1 MG/DOSE, (OZEMPIC , 1 MG/DOSE,) 4 MG/3ML SOPN [id number] Yes Inject 1 mg into the skin once a week. Jarold Medici, MD  Active   spironolactone  (ALDACTONE ) 25 MG tablet [id number] Yes Take 1/2 tablet (12.5 mg total) by mouth daily. Jarold Medici, MD  Active               Assessment/Plan:   Diabetes: - Currently uncontrolled A1c 8.1% - Reviewed goal A1c, goal fasting, and goal 2 hour post prandial glucose - Recommend to continue current therapy.  Patient expressed interest in starting basal insulin  therapy as a way to further decrease hia A1c -We discussed the differences between basal insulin  and fast acting insulin  -Patient said he did not want to take Farxiga or Jardiance due to increased urination.  There is also documentation that the patient did not want to take glipizide due to its side effects.    Hypertension: - Currently on  Olmesartan -Hydrochlorothiazide  40/25 and spironolactone  12.5 mg  daily  - Reviewed long term cardiovascular and renal outcomes of uncontrolled blood pressure - Recommend to continue current therapy    Hyperlipidemia/ASCVD Risk Reduction: - Currently controlled. LDL- 77 mg/dl - Reviewed long term complications of uncontrolled cholesterol -  Recommend to continue current therapy-Rosuvastatin  20 mg 1 tablet daily   Follow Up Plan:    Patient has an upcoming appointment with Dr. Jarold on 09/06/23 Route delayed note to Provider about basal insulin /additional therapy if A1c is elevated Follow up after PCP appointment.   Cassius DOROTHA Brought, PharmD, BCACP Clinical Pharmacist [phone]

## 2023-09-04 ENCOUNTER — Other Ambulatory Visit (HOSPITAL_COMMUNITY): Payer: Self-pay

## 2023-09-05 NOTE — Patient Instructions (Signed)
 Hypertension, Adult Hypertension is another name for high blood pressure. High blood pressure forces your heart to work harder to pump blood. This can cause problems over time. There are two numbers in a blood pressure reading. There is a top number (systolic) over a bottom number (diastolic). It is best to have a blood pressure that is below 120/80. What are the causes? The cause of this condition is not known. Some other conditions can lead to high blood pressure. What increases the risk? Some lifestyle factors can make you more likely to develop high blood pressure: Smoking. Not getting enough exercise or physical activity. Being overweight. Having too much fat, sugar, calories, or salt (sodium) in your diet. Drinking too much alcohol . Other risk factors include: Having any of these conditions: Heart disease. Diabetes. High cholesterol. Kidney disease. Obstructive sleep apnea. Having a family history of high blood pressure and high cholesterol. Age. The risk increases with age. Stress. What are the signs or symptoms? High blood pressure may not cause symptoms. Very high blood pressure (hypertensive crisis) may cause: Headache. Fast or uneven heartbeats (palpitations). Shortness of breath. Nosebleed. Vomiting or feeling like you may vomit (nauseous). Changes in how you see. Very bad chest pain. Feeling dizzy. Seizures. How is this treated? This condition is treated by making healthy lifestyle changes, such as: Eating healthy foods. Exercising more. Drinking less alcohol . Your doctor may prescribe medicine if lifestyle changes do not help enough and if: Your top number is above 130. Your bottom number is above 80. Your personal target blood pressure may vary. Follow these instructions at home: Eating and drinking  If told, follow the DASH eating plan. To follow this plan: Fill one half of your plate at each meal with fruits and vegetables. Fill one fourth of your plate  at each meal with whole grains. Whole grains include whole-wheat pasta, brown rice, and whole-grain bread. Eat or drink low-fat dairy products, such as skim milk or low-fat yogurt. Fill one fourth of your plate at each meal with low-fat (lean) proteins. Low-fat proteins include fish, chicken without skin, eggs, beans, and tofu. Avoid fatty meat, cured and processed meat, or chicken with skin. Avoid pre-made or processed food. Limit the amount of salt in your diet to less than 1,500 mg each day. Do not drink alcohol  if: Your doctor tells you not to drink. You are pregnant, may be pregnant, or are planning to become pregnant. If you drink alcohol : Limit how much you have to: 0-1 drink a day for women. 0-2 drinks a day for men. Know how much alcohol  is in your drink. In the [place] one drink equals one 12 oz bottle of beer (355 mL), one 5 oz glass of wine (148 mL), or one 1 oz glass of hard liquor (44 mL). Lifestyle  Work with your doctor to stay at a healthy weight or to lose weight. Ask your doctor what the best weight is for you. Get at least 30 minutes of exercise that causes your heart to beat faster (aerobic exercise) most days of the week. This may include walking, swimming, or biking. Get at least 30 minutes of exercise that strengthens your muscles (resistance exercise) at least 3 days a week. This may include lifting weights or doing Pilates. Do not smoke or use any products that contain nicotine  or tobacco. If you need help quitting, ask your doctor. Check your blood pressure at home as told by your doctor. Keep all follow-up visits. Medicines Take over-the-counter and prescription medicines  only as told by your doctor. Follow directions carefully. Do not skip doses of blood pressure medicine. The medicine does not work as well if you skip doses. Skipping doses also puts you at risk for problems. Ask your doctor about side effects or reactions to medicines that you should watch  for. Contact a doctor if: You think you are having a reaction to the medicine you are taking. You have headaches that keep coming back. You feel dizzy. You have swelling in your ankles. You have trouble with your vision. Get help right away if: You get a very bad headache. You start to feel mixed up (confused). You feel weak or numb. You feel faint. You have very bad pain in your: Chest. Belly (abdomen). You vomit more than once. You have trouble breathing. These symptoms may be an emergency. Get help right away. Call 911. Do not wait to see if the symptoms will go away. Do not drive yourself to the hospital. Summary Hypertension is another name for high blood pressure. High blood pressure forces your heart to work harder to pump blood. For most people, a normal blood pressure is less than 120/80. Making healthy choices can help lower blood pressure. If your blood pressure does not get lower with healthy choices, you may need to take medicine. This information is not intended to replace advice given to you by your health care provider. Make sure you discuss any questions you have with your health care provider. Document Revised: 10/22/2020 Document Reviewed: 10/22/2020 Elsevier Patient Education  2024 [employer].

## 2023-09-05 NOTE — Progress Notes (Signed)
 I,Phillip Armstrong, CMA,acting as a neurosurgeon for Phillip LOISE Slocumb, MD.,have documented all relevant documentation on the behalf of Phillip LOISE Slocumb, MD,as directed by  Phillip LOISE Slocumb, MD while in the presence of Phillip LOISE Slocumb, MD.  Subjective:  Patient ID: Phillip Armstrong , male    DOB: 19-Sep-1965 , 58 y.o.   MRN: [id number]  Chief Complaint  Patient presents with   Hypertension    Patient presents today for a bp and dm follow up, Patient reports compliance with medication. Patient denies any chest pain, SOB, or headaches. Patient has no concerns today.    Diabetes    HPI Discussed the use of AI scribe software for clinical note transcription with the patient, who gave verbal consent to proceed.  History of Present Illness [name] is a 58 year old male with diabetes and hypertension who presents for a diabetes and blood pressure check.  He has been monitoring his blood sugar levels at home, with readings of 107 mg/dL, 87 mg/dL, and 895-892 mg/dL in the mornings. A high reading of 175 mg/dL was noted after dietary indiscretion. His current diabetes management includes Ozempic , metformin  twice daily, and a lower dose of Actos .  He takes amlodipine  in the evening, olmesartan  40/25 mg in the morning, and half a pill of spironolactone  in the morning. He feels good and notes that his blood pressure control is better over 24 hours.  He maintains an active lifestyle, engaging in regular exercise, walking, yard work, and stretching. His youngest son recently started college, which has been an emotional adjustment for him.   Diabetes He presents for his follow-up diabetic visit. He has type 2 diabetes mellitus. There are no hypoglycemic associated symptoms. There are no diabetic associated symptoms. Pertinent negatives for diabetes include no blurred vision, no polydipsia, no polyphagia and no polyuria. There are no diabetic complications. An ACE inhibitor/angiotensin II receptor blocker is  being taken. Eye exam is current.  Hypertension This is a chronic problem. The current episode started yesterday. The problem has been gradually improving since onset. The problem is controlled. Pertinent negatives include no blurred vision or palpitations. Risk factors for coronary artery disease include dyslipidemia and diabetes mellitus. Past treatments include angiotensin blockers and diuretics. The current treatment provides moderate improvement.     Past Medical History:  Diagnosis Date   Diabetes mellitus 2000   T2DM   Hyperlipidemia    Hypertension      Family History  Problem Relation Age of Onset   Diabetes Son        T1DM   Hypertension Other    Hypertension Father      Current Outpatient Medications:    amLODipine  (NORVASC ) 10 MG tablet, Take 1 tablet (10 mg total) by mouth daily., Disp: 90 tablet, Rfl: 1   aspirin 81 MG tablet, Take 81 mg by mouth daily., Disp: , Rfl:    Blood Glucose Monitoring Suppl (FREESTYLE LITE) DEVI, Use as directed to check blood sugars 2 times per day dx: e11.65, Disp: 1 each, Rfl: 1   glucose blood (FREESTYLE LITE) test strip, Use to check blood sugar 2 (two) times daily as directed, Disp: 150 each, Rfl: 2   Lancets (FREESTYLE) lancets, Use as instructed to check blood sugars 2 times daily., Disp: 150 each, Rfl: 2   meloxicam  (MOBIC ) 15 MG tablet, Take 1 tablet (15 mg total) by mouth daily as needed for knee pain., Disp: 30 tablet, Rfl: 1   metFORMIN  (GLUCOPHAGE ) 1000 MG tablet, Take  1 tablet (1,000 mg total) by mouth 2 (two) times daily with a meal., Disp: 180 tablet, Rfl: 1   Multiple Vitamins-Minerals (MULTIVITAMIN WITH MINERALS) tablet, Take 1 tablet by mouth daily., Disp: , Rfl:    olmesartan -hydrochlorothiazide  (BENICAR  HCT) 40-25 MG tablet, Take 1 tablet by mouth daily., Disp: 90 tablet, Rfl: 2   pioglitazone  (ACTOS ) 15 MG tablet, Take 1 tablet (15 mg total) by mouth daily., Disp: 90 tablet, Rfl: 2   rosuvastatin  (CRESTOR ) 20 MG tablet,  Take 1 tablet (20 mg total) by mouth daily., Disp: 90 tablet, Rfl: 2   Semaglutide , 1 MG/DOSE, (OZEMPIC , 1 MG/DOSE,) 4 MG/3ML SOPN, Inject 1 mg into the skin once a week., Disp: 9 mL, Rfl: 2   spironolactone  (ALDACTONE ) 25 MG tablet, Take 1/2 tablet (12.5 mg total) by mouth daily., Disp: 45 tablet, Rfl: 2   No Known Allergies   Review of Systems  Constitutional: Negative.   Eyes:  Negative for blurred vision.  Respiratory: Negative.    Cardiovascular: Negative.  Negative for palpitations.  Gastrointestinal: Negative.   Endocrine: Negative for polydipsia, polyphagia and polyuria.  Skin: Negative.   Allergic/Immunologic: Negative.   Hematological: Negative.      Today's Vitals   09/06/23 0840  BP: 120/80  Pulse: 95  Temp: 98.2 F (36.8 C)  TempSrc: Oral  Weight: 156 lb 6.4 oz (70.9 kg)  Height: 5' 4 (1.626 m)  PainSc: 0-No pain   Body mass index is 26.85 kg/m.  Wt Readings from Last 3 Encounters:  09/06/23 156 lb 6.4 oz (70.9 kg)  04/28/23 157 lb (71.2 kg)  04/20/23 157 lb 9.6 oz (71.5 kg)     Objective:  Physical Exam Vitals and nursing note reviewed.  Constitutional:      Appearance: Normal appearance.  HENT:     Head: Normocephalic and atraumatic.  Eyes:     Extraocular Movements: Extraocular movements intact.  Cardiovascular:     Rate and Rhythm: Normal rate and regular rhythm.     Heart sounds: Normal heart sounds.  Pulmonary:     Effort: Pulmonary effort is normal.     Breath sounds: Normal breath sounds.  Musculoskeletal:     Cervical back: Normal range of motion.  Skin:    General: Skin is warm.  Neurological:     General: No focal deficit present.     Mental Status: He is alert.  Psychiatric:        Mood and Affect: Mood normal.       Assessment And Plan:  Essential hypertension Assessment & Plan: Blood pressure is better controlled.  He will continue with   amlodipine  10 mg every day and olmesartan /hct 40/25mg  qd - He is also taking  spironolactone  12.5 mg daily in the morning. - Follow low sodium diet   Orders: -     CMP14+EGFR  Dyslipidemia associated with type 2 diabetes mellitus (HCC) Assessment & Plan: Chronic, LDL goal is less than 70. He will continue with rosuvastatin  20mg  daily.  He does not wish to take Farxiga or Jardiance due to increased urination. He will continue with metformin  1000mg  twice daily, Ozempic  1mg  weekly and pioglitazone  15mg  daily. I have tried to wean him off of pioglitazone ; however, he prefers to stay on this medication. - If needed, he is willing to start insulin  for improved glycemic control.  - Check blood glucose twice daily, three days a week. [facility] pharmacy input is appreciated, will also consider glipizide  Orders: -  CMP14+EGFR -     Hemoglobin A1c  Overweight with body mass index (BMI) of 26 to 26.9 in adult Assessment & Plan: He is encouraged to aim for at least 150 minutes of exercise per week.     Assessment & Plan Type 2 diabetes mellitus Blood glucose generally within target range, isolated reading of 175 mg/dL after dietary indiscretion. A1c pending to assess need for insulin . Discussed diabetes progression and potential insulin  requirement. - Continue Ozempic , metformin , and Actos . - Await A1c results for insulin  decision. - Consult pharmacist for alternative oral medications. - Send lab results and instructions if A1c > 8. - Maintain physical activity.  Return if symptoms worsen or fail to improve.  Patient was given opportunity to ask questions. Patient verbalized understanding of the plan and was able to repeat key elements of the plan. All questions were answered to their satisfaction.   I, Phillip LOISE Slocumb, MD, have reviewed all documentation for this visit. The documentation on 09/06/23 for the exam, diagnosis, procedures, and orders are all accurate and complete.   IF YOU HAVE BEEN REFERRED TO A SPECIALIST, IT MAY TAKE 1-2 WEEKS TO SCHEDULE/PROCESS THE  REFERRAL. IF YOU HAVE NOT HEARD FROM US /SPECIALIST IN TWO WEEKS, PLEASE GIVE US  A CALL AT [phone].   THE PATIENT IS ENCOURAGED TO PRACTICE SOCIAL DISTANCING DUE TO THE COVID-19 PANDEMIC.

## 2023-09-06 ENCOUNTER — Ambulatory Visit: Admitting: Internal Medicine

## 2023-09-06 ENCOUNTER — Encounter: Payer: Self-pay | Admitting: Internal Medicine

## 2023-09-06 VITALS — BP 120/80 | HR 95 | Temp 98.2°F | Ht 64.0 in | Wt 156.4 lb

## 2023-09-06 DIAGNOSIS — E1169 Type 2 diabetes mellitus with other specified complication: Secondary | ICD-10-CM | POA: Diagnosis not present

## 2023-09-06 DIAGNOSIS — E663 Overweight: Secondary | ICD-10-CM

## 2023-09-06 DIAGNOSIS — E7800 Pure hypercholesterolemia, unspecified: Secondary | ICD-10-CM

## 2023-09-06 DIAGNOSIS — Z6826 Body mass index (BMI) 26.0-26.9, adult: Secondary | ICD-10-CM | POA: Diagnosis not present

## 2023-09-06 DIAGNOSIS — I10 Essential (primary) hypertension: Secondary | ICD-10-CM

## 2023-09-06 DIAGNOSIS — E785 Hyperlipidemia, unspecified: Secondary | ICD-10-CM | POA: Diagnosis not present

## 2023-09-06 NOTE — Assessment & Plan Note (Addendum)
 Chronic, LDL goal is less than 70. He will continue with rosuvastatin  20mg  daily.  He does not wish to take Farxiga or Jardiance due to increased urination. He will continue with metformin  1000mg  twice daily, Ozempic  1mg  weekly and pioglitazone  15mg  daily. I have tried to wean him off of pioglitazone ; however, he prefers to stay on this medication. - If needed, he is willing to start insulin  for improved glycemic control.  - Check blood glucose twice daily, three days a week. [facility] pharmacy input is appreciated, will also consider glipizide

## 2023-09-06 NOTE — Assessment & Plan Note (Signed)
He is encouraged to aim for at least 150 minutes of exercise per week.

## 2023-09-06 NOTE — Assessment & Plan Note (Signed)
 Blood pressure is better controlled.  He will continue with   amlodipine  10 mg every day and olmesartan /hct 40/25mg  qd - He is also taking spironolactone  12.5 mg daily in the morning. - Follow low sodium diet

## 2023-09-07 ENCOUNTER — Ambulatory Visit: Payer: Self-pay | Admitting: Internal Medicine

## 2023-09-07 LAB — HEMOGLOBIN A1C
Est. average glucose Bld gHb Est-mCnc: 189 mg/dL
Hgb A1c MFr Bld: 8.2 % — ABNORMAL HIGH (ref 4.8–5.6)

## 2023-09-07 LAB — CMP14+EGFR
ALT: 18 IU/L (ref 0–44)
AST: 18 IU/L (ref 0–40)
Albumin: 4.7 g/dL (ref 3.8–4.9)
Alkaline Phosphatase: 78 IU/L (ref 44–121)
BUN/Creatinine Ratio: 15 (ref 9–20)
BUN: 15 mg/dL (ref 6–24)
Bilirubin Total: 0.7 mg/dL (ref 0.0–1.2)
CO2: 23 mmol/L (ref 20–29)
Calcium: 9.9 mg/dL (ref 8.7–10.2)
Chloride: 95 mmol/L — ABNORMAL LOW (ref 96–106)
Creatinine, Ser: 0.98 mg/dL (ref 0.76–1.27)
Globulin, Total: 2.4 g/dL (ref 1.5–4.5)
Glucose: 148 mg/dL — ABNORMAL HIGH (ref 70–99)
Potassium: 3.9 mmol/L (ref 3.5–5.2)
Sodium: 136 mmol/L (ref 134–144)
Total Protein: 7.1 g/dL (ref 6.0–8.5)
eGFR: 90 mL/min/1.73 (ref >59)

## 2023-09-13 ENCOUNTER — Other Ambulatory Visit (HOSPITAL_COMMUNITY): Payer: Self-pay

## 2023-09-13 ENCOUNTER — Other Ambulatory Visit: Payer: Self-pay | Admitting: Pharmacist

## 2023-09-13 ENCOUNTER — Other Ambulatory Visit (HOSPITAL_BASED_OUTPATIENT_CLINIC_OR_DEPARTMENT_OTHER): Payer: Self-pay

## 2023-09-13 DIAGNOSIS — E1165 Type 2 diabetes mellitus with hyperglycemia: Secondary | ICD-10-CM

## 2023-09-13 MED ORDER — PEN NEEDLES 32G X 4 MM MISC
12 refills | Status: AC
  Filled 2023-09-13: qty 100, 90d supply, fill #0
  Filled 2024-01-04: qty 100, 90d supply, fill #1

## 2023-09-13 MED ORDER — LANTUS SOLOSTAR 100 UNIT/ML ~~LOC~~ SOPN
50.0000 [IU] | PEN_INJECTOR | Freq: Every day | SUBCUTANEOUS | 99 refills | Status: AC
  Filled 2023-09-13: qty 15, 30d supply, fill #0
  Filled 2023-12-15: qty 15, 30d supply, fill #1
  Filled 2024-01-10: qty 15, 30d supply, fill #2
  Filled 2024-02-19: qty 15, 30d supply, fill #3

## 2023-09-14 NOTE — Progress Notes (Signed)
   09/14/2023  Patient ID: Phillip Armstrong, male   DOB: 05/08/1965, 58 y.o.   MRN: [id number]  Patient in clinic for long acting insulin  injection training. He is 58 years old and has a HgA1c of 8.2%. Patient opted for long acting insulin  instead of increasing his Ozempic  dose due to not wanting to lose any more weight.  He has a past medical history significant for:  type 2 diabetes, hyperlipidemia, and hypertension. We were going to go with [place] but Lantus  is what is covered by his insurance.  Lantus  Injection Education:  Reviewed indication: long-acting insulin  for glucose control. Taught injection technique: hand hygiene, verify medication/expiration, attach new needle, prime (2 units), dial prescribed dose, inject at 90 angle in abdomen/thigh/upper arm, hold >=6 seconds, rotate sites, dispose of needle in sharps container. Patient successfully demonstrated return technique.  Storage Counseling: Store unopened pens refrigerated. In-use pens may be stored at room temperature (<15F) for up to 8 weeks. Avoid freezing, heat, or direct sunlight.  Hypoglycemia Education: Defined hypoglycemia (<70 mg/dL or symptoms: sweating, shaking, dizziness, confusion, irritability, hunger). Reviewed "Rule of 15": Take 15 g of rapid carbohydrate (juice, glucose tabs, honey). Recheck BG in 15 minutes. Repeat if <70 mg/dL. Eat snack if next meal >1 hr away. Instructed to notify provider for recurrent hypoglycemia.  Plan: Follow up with the Patient in 3-4 weeks as he is not new to injecting himself as he is on Ozempic .   Phillip Armstrong, PharmD, BCACP Clinical Pharmacist [phone]

## 2023-09-20 ENCOUNTER — Other Ambulatory Visit (HOSPITAL_COMMUNITY): Payer: Self-pay

## 2023-09-24 ENCOUNTER — Other Ambulatory Visit: Payer: Self-pay | Admitting: Internal Medicine

## 2023-09-25 ENCOUNTER — Other Ambulatory Visit: Payer: Self-pay

## 2023-09-25 ENCOUNTER — Other Ambulatory Visit (HOSPITAL_COMMUNITY): Payer: Self-pay

## 2023-09-25 MED ORDER — METFORMIN HCL 1000 MG PO TABS
1000.0000 mg | ORAL_TABLET | Freq: Two times a day (BID) | ORAL | 1 refills | Status: AC
  Filled 2023-09-25: qty 180, 90d supply, fill #0
  Filled 2023-11-20 – 2023-12-20 (×2): qty 180, 90d supply, fill #1

## 2023-10-03 ENCOUNTER — Other Ambulatory Visit: Payer: Self-pay | Admitting: Internal Medicine

## 2023-10-03 ENCOUNTER — Other Ambulatory Visit (HOSPITAL_COMMUNITY): Payer: Self-pay

## 2023-10-03 MED ORDER — FREESTYLE LITE TEST VI STRP
ORAL_STRIP | Freq: Two times a day (BID) | 2 refills | Status: AC
  Filled 2023-10-03: qty 150, 75d supply, fill #0
  Filled 2023-12-15: qty 200, 90d supply, fill #1

## 2023-10-16 ENCOUNTER — Other Ambulatory Visit: Payer: Self-pay | Admitting: Internal Medicine

## 2023-10-16 DIAGNOSIS — I10 Essential (primary) hypertension: Secondary | ICD-10-CM

## 2023-10-17 ENCOUNTER — Other Ambulatory Visit (HOSPITAL_COMMUNITY): Payer: Self-pay

## 2023-10-17 MED ORDER — AMLODIPINE BESYLATE 10 MG PO TABS
10.0000 mg | ORAL_TABLET | Freq: Every day | ORAL | 1 refills | Status: DC
  Filled 2023-10-17: qty 90, 90d supply, fill #0
  Filled 2024-01-10: qty 90, 90d supply, fill #1

## 2023-11-20 ENCOUNTER — Other Ambulatory Visit: Payer: Self-pay | Admitting: Internal Medicine

## 2023-11-20 DIAGNOSIS — E1169 Type 2 diabetes mellitus with other specified complication: Secondary | ICD-10-CM

## 2023-11-20 DIAGNOSIS — I10 Essential (primary) hypertension: Secondary | ICD-10-CM

## 2023-11-21 ENCOUNTER — Other Ambulatory Visit (HOSPITAL_COMMUNITY): Payer: Self-pay

## 2023-11-21 ENCOUNTER — Other Ambulatory Visit: Payer: Self-pay

## 2023-11-21 MED ORDER — OZEMPIC (1 MG/DOSE) 4 MG/3ML ~~LOC~~ SOPN
1.0000 mg | PEN_INJECTOR | SUBCUTANEOUS | 2 refills | Status: AC
  Filled 2023-11-21: qty 9, 84d supply, fill #0
  Filled 2024-02-19: qty 9, 84d supply, fill #1

## 2023-11-21 MED ORDER — OLMESARTAN MEDOXOMIL-HCTZ 40-25 MG PO TABS
1.0000 | ORAL_TABLET | Freq: Every day | ORAL | 2 refills | Status: AC
  Filled 2023-11-21: qty 90, 90d supply, fill #0
  Filled 2023-12-20 – 2024-02-19 (×2): qty 90, 90d supply, fill #1

## 2023-12-15 ENCOUNTER — Other Ambulatory Visit (HOSPITAL_COMMUNITY): Payer: Self-pay

## 2023-12-15 ENCOUNTER — Other Ambulatory Visit: Payer: Self-pay | Admitting: Internal Medicine

## 2023-12-15 ENCOUNTER — Other Ambulatory Visit: Payer: Self-pay

## 2023-12-15 DIAGNOSIS — E7800 Pure hypercholesterolemia, unspecified: Secondary | ICD-10-CM

## 2023-12-18 ENCOUNTER — Other Ambulatory Visit (HOSPITAL_COMMUNITY): Payer: Self-pay

## 2023-12-18 MED ORDER — ROSUVASTATIN CALCIUM 20 MG PO TABS
20.0000 mg | ORAL_TABLET | Freq: Every day | ORAL | 2 refills | Status: AC
  Filled 2023-12-18: qty 90, 90d supply, fill #0

## 2023-12-20 ENCOUNTER — Other Ambulatory Visit (HOSPITAL_COMMUNITY): Payer: Self-pay

## 2023-12-20 ENCOUNTER — Other Ambulatory Visit: Payer: Self-pay | Admitting: Internal Medicine

## 2023-12-20 ENCOUNTER — Other Ambulatory Visit: Payer: Self-pay

## 2023-12-21 ENCOUNTER — Other Ambulatory Visit (HOSPITAL_COMMUNITY): Payer: Self-pay

## 2023-12-21 ENCOUNTER — Other Ambulatory Visit: Payer: Self-pay

## 2023-12-21 MED ORDER — SPIRONOLACTONE 25 MG PO TABS
12.5000 mg | ORAL_TABLET | Freq: Every day | ORAL | 2 refills | Status: AC
  Filled 2023-12-21: qty 45, 90d supply, fill #0

## 2023-12-26 ENCOUNTER — Other Ambulatory Visit: Payer: Self-pay | Admitting: Internal Medicine

## 2023-12-26 DIAGNOSIS — E1169 Type 2 diabetes mellitus with other specified complication: Secondary | ICD-10-CM

## 2023-12-27 ENCOUNTER — Other Ambulatory Visit (HOSPITAL_COMMUNITY): Payer: Self-pay

## 2023-12-27 MED ORDER — PIOGLITAZONE HCL 15 MG PO TABS
15.0000 mg | ORAL_TABLET | Freq: Every day | ORAL | 2 refills | Status: AC
  Filled 2023-12-27: qty 90, 90d supply, fill #0

## 2023-12-29 ENCOUNTER — Other Ambulatory Visit (HOSPITAL_COMMUNITY): Payer: Self-pay

## 2024-01-03 DIAGNOSIS — H524 Presbyopia: Secondary | ICD-10-CM | POA: Diagnosis not present

## 2024-01-03 LAB — HM DIABETES EYE EXAM

## 2024-01-03 LAB — OPHTHALMOLOGY REPORT-SCANNED

## 2024-01-04 ENCOUNTER — Other Ambulatory Visit (HOSPITAL_COMMUNITY): Payer: Self-pay

## 2024-01-31 ENCOUNTER — Ambulatory Visit (INDEPENDENT_AMBULATORY_CARE_PROVIDER_SITE_OTHER): Payer: Commercial Managed Care - PPO | Admitting: Internal Medicine

## 2024-01-31 ENCOUNTER — Other Ambulatory Visit (HOSPITAL_COMMUNITY): Payer: Self-pay

## 2024-01-31 ENCOUNTER — Encounter: Payer: Self-pay | Admitting: Internal Medicine

## 2024-01-31 VITALS — BP 120/80 | HR 94 | Temp 98.3°F | Ht 64.0 in | Wt 165.6 lb

## 2024-01-31 DIAGNOSIS — E785 Hyperlipidemia, unspecified: Secondary | ICD-10-CM | POA: Diagnosis not present

## 2024-01-31 DIAGNOSIS — I10 Essential (primary) hypertension: Secondary | ICD-10-CM

## 2024-01-31 DIAGNOSIS — E1169 Type 2 diabetes mellitus with other specified complication: Secondary | ICD-10-CM

## 2024-01-31 DIAGNOSIS — Z0000 Encounter for general adult medical examination without abnormal findings: Secondary | ICD-10-CM | POA: Diagnosis not present

## 2024-01-31 MED ORDER — AMLODIPINE BESYLATE 10 MG PO TABS
10.0000 mg | ORAL_TABLET | Freq: Every day | ORAL | 2 refills | Status: AC
  Filled 2024-01-31: qty 90, 90d supply, fill #0

## 2024-01-31 MED ORDER — MELOXICAM 15 MG PO TABS
15.0000 mg | ORAL_TABLET | Freq: Every day | ORAL | 1 refills | Status: AC | PRN
  Filled 2024-01-31 – 2024-02-15 (×2): qty 30, 30d supply, fill #0

## 2024-01-31 NOTE — Assessment & Plan Note (Addendum)
 Chronic, diabetic foot exam was performed.  Blood glucose levels well-controlled. No signs of diabetic neuropathy. Eye exam unremarkable. - Continue insulin , Actos  and Ozempic . - LDL goal is less than 70, continue with rosuvastatin  - We discussed use of cardiac calcium  scoring for cardiac risk assessment - Follow-up with ophthalmologist next month. - I DISCUSSED WITH THE PATIENT AT LENGTH REGARDING THE GOALS OF GLYCEMIC CONTROL AND POSSIBLE LONG-TERM COMPLICATIONS.  I  ALSO STRESSED THE IMPORTANCE OF COMPLIANCE WITH HOME GLUCOSE MONITORING, DIETARY RESTRICTIONS INCLUDING AVOIDANCE OF SUGARY DRINKS/PROCESSED FOODS,  ALONG WITH REGULAR EXERCISE.  I  ALSO STRESSED THE IMPORTANCE OF ANNUAL EYE EXAMS, SELF FOOT CARE AND COMPLIANCE WITH OFFICE VISITS.

## 2024-01-31 NOTE — Assessment & Plan Note (Signed)
 Chronic, fair control. Goal BP <130/80.  EKG performed, NSR w/o acute changes.   - Continue with olmesartan /hydrochlorothiazide , amlodipine  and spironolactone .  - Follow low sodium diet.  - Reassess in four months.

## 2024-01-31 NOTE — Patient Instructions (Signed)
 Health Maintenance, Male  Adopting a healthy lifestyle and getting preventive care are important in promoting health and wellness. Ask your health care provider about:  The right schedule for you to have regular tests and exams.  Things you can do on your own to prevent diseases and keep yourself healthy.  What should I know about diet, weight, and exercise?  Eat a healthy diet    Eat a diet that includes plenty of vegetables, fruits, low-fat dairy products, and lean protein.  Do not eat a lot of foods that are high in solid fats, added sugars, or sodium.  Maintain a healthy weight  Body mass index (BMI) is a measurement that can be used to identify possible weight problems. It estimates body fat based on height and weight. Your health care provider can help determine your BMI and help you achieve or maintain a healthy weight.  Get regular exercise  Get regular exercise. This is one of the most important things you can do for your health. Most adults should:  Exercise for at least 150 minutes each week. The exercise should increase your heart rate and make you sweat (moderate-intensity exercise).  Do strengthening exercises at least twice a week. This is in addition to the moderate-intensity exercise.  Spend less time sitting. Even light physical activity can be beneficial.  Watch cholesterol and blood lipids  Have your blood tested for lipids and cholesterol at 59 years of age, then have this test every 5 years.  You may need to have your cholesterol levels checked more often if:  Your lipid or cholesterol levels are high.  You are older than 59 years of age.  You are at high risk for heart disease.  What should I know about cancer screening?  Many types of cancers can be detected early and may often be prevented. Depending on your health history and family history, you may need to have cancer screening at various ages. This may include screening for:  Colorectal cancer.  Prostate cancer.  Skin cancer.  Lung  cancer.  What should I know about heart disease, diabetes, and high blood pressure?  Blood pressure and heart disease  High blood pressure causes heart disease and increases the risk of stroke. This is more likely to develop in people who have high blood pressure readings or are overweight.  Talk with your health care provider about your target blood pressure readings.  Have your blood pressure checked:  Every 3-5 years if you are 24-52 years of age.  Every year if you are 3 years old or older.  If you are between the ages of 60 and 72 and are a current or former smoker, ask your health care provider if you should have a one-time screening for abdominal aortic aneurysm (AAA).  Diabetes  Have regular diabetes screenings. This checks your fasting blood sugar level. Have the screening done:  Once every three years after age 66 if you are at a normal weight and have a low risk for diabetes.  More often and at a younger age if you are overweight or have a high risk for diabetes.  What should I know about preventing infection?  Hepatitis B  If you have a higher risk for hepatitis B, you should be screened for this virus. Talk with your health care provider to find out if you are at risk for hepatitis B infection.  Hepatitis C  Blood testing is recommended for:  Everyone born from [birth year] through 1965.  Anyone  with known risk factors for hepatitis C.  Sexually transmitted infections (STIs)  You should be screened each year for STIs, including gonorrhea and chlamydia, if:  You are sexually active and are younger than 59 years of age.  You are older than 59 years of age and your health care provider tells you that you are at risk for this type of infection.  Your sexual activity has changed since you were last screened, and you are at increased risk for chlamydia or gonorrhea. Ask your health care provider if you are at risk.  Ask your health care provider about whether you are at high risk for HIV. Your health care provider  may recommend a prescription medicine to help prevent HIV infection. If you choose to take medicine to prevent HIV, you should first get tested for HIV. You should then be tested every 3 months for as long as you are taking the medicine.  Follow these instructions at home:  Alcohol use  Do not drink alcohol if your health care provider tells you not to drink.  If you drink alcohol:  Limit how much you have to 0-2 drinks a day.  Know how much alcohol is in your drink. In the [place] one drink equals one 12 oz bottle of beer (355 mL), one 5 oz glass of wine (148 mL), or one 1 oz glass of hard liquor (44 mL).  Lifestyle  Do not use any products that contain nicotine or tobacco. These products include cigarettes, chewing tobacco, and vaping devices, such as e-cigarettes. If you need help quitting, ask your health care provider.  Do not use street drugs.  Do not share needles.  Ask your health care provider for help if you need support or information about quitting drugs.  General instructions  Schedule regular health, dental, and eye exams.  Stay current with your vaccines.  Tell your health care provider if:  You often feel depressed.  You have ever been abused or do not feel safe at home.  Summary  Adopting a healthy lifestyle and getting preventive care are important in promoting health and wellness.  Follow your health care provider's instructions about healthy diet, exercising, and getting tested or screened for diseases.  Follow your health care provider's instructions on monitoring your cholesterol and blood pressure.  This information is not intended to replace advice given to you by your health care provider. Make sure you discuss any questions you have with your health care provider.  Document Revised: 05/25/2020 Document Reviewed: 05/25/2020  Elsevier Patient Education  2024 [employer].

## 2024-01-31 NOTE — Progress Notes (Signed)
 I,[name], CMA,acting as a neurosurgeon for Phillip LOISE Slocumb, MD.,have documented all relevant documentation on the behalf of Phillip LOISE Slocumb, MD,as directed by  Phillip LOISE Slocumb, MD while in the presence of Phillip LOISE Slocumb, MD.  Subjective:  Patient ID: Phillip Armstrong , male    DOB: 05-29-1965 , 59 y.o.   MRN: [id number]  Chief Complaint  Patient presents with   Annual Exam    He is here today for a full physical examination. He has no specific concerns or complaints at this time. He reports compliance with meds. He denies having any headaches, chest pain and shortness of breath.    Diabetes   Hypertension   Hyperlipidemia    HPI  Diabetes He presents for his follow-up diabetic visit. He has type 2 diabetes mellitus. There are no hypoglycemic associated symptoms. There are no diabetic associated symptoms. Pertinent negatives for diabetes include no blurred vision. There are no diabetic complications. An ACE inhibitor/angiotensin II receptor blocker is being taken. Eye exam is current.  Hypertension This is a chronic problem. The current episode started yesterday. The problem has been gradually improving since onset. The problem is controlled. Pertinent negatives include no blurred vision. Risk factors for coronary artery disease include dyslipidemia and diabetes mellitus. Past treatments include angiotensin blockers and diuretics. The current treatment provides moderate improvement.  Hyperlipidemia This is a chronic problem. The current episode started more than 1 year ago. The problem is controlled. Exacerbating diseases include diabetes. Current antihyperlipidemic treatment includes statins.     Past Medical History:  Diagnosis Date   Diabetes mellitus 2000   T2DM   Hyperlipidemia    Hypertension      Family History  Problem Relation Age of Onset   Diabetes Son        T1DM   Hypertension Other    Hypertension Father      Current Outpatient Medications:     aspirin 81 MG tablet, Take 81 mg by mouth daily., Disp: , Rfl:    Blood Glucose Monitoring Suppl (FREESTYLE LITE) DEVI, Use as directed to check blood sugars 2 times per day dx: e11.65, Disp: 1 each, Rfl: 1   glucose blood (FREESTYLE LITE) test strip, Use to check blood sugar 2 (two) times daily as directed, Disp: 150 each, Rfl: 2   insulin  glargine (LANTUS  SOLOSTAR) 100 UNIT/ML Solostar Pen, Inject 10 units under the skin once daily (dose will be titrated to a max daily dose of 50 units daily), Disp: 15 mL, Rfl: PRN   Insulin  Pen Needle (PEN NEEDLES) 32G X 4 MM MISC, Use with daily insulin  injection, Disp: 100 each, Rfl: 12   Lancets (FREESTYLE) lancets, Use as instructed to check blood sugars 2 times daily., Disp: 150 each, Rfl: 2   metFORMIN  (GLUCOPHAGE ) 1000 MG tablet, Take 1 tablet (1,000 mg total) by mouth 2 (two) times daily with a meal., Disp: 180 tablet, Rfl: 1   Multiple Vitamins-Minerals (MULTIVITAMIN WITH MINERALS) tablet, Take 1 tablet by mouth daily., Disp: , Rfl:    olmesartan -hydrochlorothiazide  (BENICAR  HCT) 40-25 MG tablet, Take 1 tablet by mouth daily., Disp: 90 tablet, Rfl: 2   pioglitazone  (ACTOS ) 15 MG tablet, Take 1 tablet (15 mg total) by mouth daily., Disp: 90 tablet, Rfl: 2   rosuvastatin  (CRESTOR ) 20 MG tablet, Take 1 tablet (20 mg total) by mouth daily., Disp: 90 tablet, Rfl: 2   Semaglutide , 1 MG/DOSE, (OZEMPIC , 1 MG/DOSE,) 4 MG/3ML SOPN, Inject 1 mg into the skin once a week.,  Disp: 9 mL, Rfl: 2   spironolactone  (ALDACTONE ) 25 MG tablet, Take 1/2 tablet (12.5 mg total) by mouth daily., Disp: 45 tablet, Rfl: 2   amLODipine  (NORVASC ) 10 MG tablet, Take 1 tablet (10 mg total) by mouth daily., Disp: 90 tablet, Rfl: 2   meloxicam  (MOBIC ) 15 MG tablet, Take 1 tablet (15 mg total) by mouth daily as needed for knee pain., Disp: 30 tablet, Rfl: 1   No Known Allergies   Review of Systems  Constitutional: Negative.   HENT: Negative.    Eyes: Negative.  Negative  for blurred vision.  Respiratory: Negative.    Cardiovascular: Negative.   Gastrointestinal: Negative.   Endocrine: Negative.   Genitourinary: Negative.   Musculoskeletal: Negative.   Skin: Negative.   Allergic/Immunologic: Negative.   Neurological: Negative.   Hematological: Negative.   Psychiatric/Behavioral: Negative.       Today's Vitals   01/31/24 0850  BP: 120/80  Pulse: 94  Temp: 98.3 F (36.8 C)  SpO2: 98%  Weight: 165 lb 9.6 oz (75.1 kg)  Height: 5' 4 (1.626 m)   Body mass index is 28.43 kg/m.  Wt Readings from Last 3 Encounters:  01/31/24 165 lb 9.6 oz (75.1 kg)  09/06/23 156 lb 6.4 oz (70.9 kg)  04/28/23 157 lb (71.2 kg)     Objective:  Physical Exam Vitals and nursing note reviewed.  Constitutional:      Appearance: Normal appearance.  HENT:     Head: Normocephalic and atraumatic.     Right Ear: Tympanic membrane, ear canal and external ear normal.     Left Ear: Tympanic membrane, ear canal and external ear normal.     Nose: Nose normal.     Mouth/Throat:     Mouth: Mucous membranes are moist.     Pharynx: Oropharynx is clear.  Eyes:     Extraocular Movements: Extraocular movements intact.     Conjunctiva/sclera: Conjunctivae normal.     Pupils: Pupils are equal, round, and reactive to light.  Cardiovascular:     Rate and Rhythm: Normal rate and regular rhythm.     Pulses: Normal pulses.          Dorsalis pedis pulses are 2+ on the right side and 2+ on the left side.     Heart sounds: Normal heart sounds.  Pulmonary:     Effort: Pulmonary effort is normal.     Breath sounds: Normal breath sounds.  Chest:  Breasts:    Right: Normal. No swelling, bleeding, inverted nipple, mass or nipple discharge.     Left: Normal. No swelling, bleeding, inverted nipple, mass or nipple discharge.  Abdominal:     General: Abdomen is flat. Bowel sounds are normal.     Palpations: Abdomen is soft.  Genitourinary:    Comments: Deferred  Musculoskeletal:         General: Normal range of motion.     Cervical back: Normal range of motion and neck supple.  Feet:     Right foot:     Protective Sensation: 5 sites tested.  5 sites sensed.     Skin integrity: Dry skin present.     Toenail Condition: Right toenails are normal.     Left foot:     Protective Sensation: 5 sites tested.  5 sites sensed.     Skin integrity: Dry skin present.     Toenail Condition: Left toenails are normal.  Skin:    General: Skin is warm.  Neurological:  General: No focal deficit present.     Mental Status: He is alert.  Psychiatric:        Mood and Affect: Mood normal.        Behavior: Behavior normal.         Assessment And Plan:  Encounter for general adult medical examination w/o abnormal findings Assessment & Plan: A full exam was performed.  DRE deferred, per patient request.  He is advised to get 30-45 minutes of regular exercise, no less than four to five days per week. Both weight-bearing and aerobic exercises are recommended.  He is advised to follow a healthy diet with at least six fruits/veggies per day, decrease intake of red meat and other saturated fats and to increase fish intake to twice weekly.  Meats/fish should not be fried -- baked, boiled or broiled is preferable. It is also important to cut back on your sugar intake.  Be sure to read labels - try to avoid anything with added sugar, high fructose corn syrup or other sweeteners.  If you must use a sweetener, you can try stevia or monkfruit.  It is also important to avoid artificially sweetened foods/beverages and diet drinks. Lastly, wear SPF 50 sunscreen on exposed skin and when in direct sunlight for an extended period of time.  Be sure to avoid fast food restaurants and aim for at least 60 ounces of water daily.      Orders: -     CMP14+EGFR -     Lipid panel -     Hemoglobin A1c -     CBC -     PSA  Dyslipidemia associated with type 2 diabetes mellitus (HCC) Assessment & Plan: Chronic,  diabetic foot exam was performed.  Blood glucose levels well-controlled. No signs of diabetic neuropathy. Eye exam unremarkable. - Continue insulin , Actos  and Ozempic . - LDL goal is less than 70, continue with rosuvastatin  - We discussed use of cardiac calcium  scoring for cardiac risk assessment - Follow-up with ophthalmologist next month. - I DISCUSSED WITH THE PATIENT AT LENGTH REGARDING THE GOALS OF GLYCEMIC CONTROL AND POSSIBLE LONG-TERM COMPLICATIONS.  I  ALSO STRESSED THE IMPORTANCE OF COMPLIANCE WITH HOME GLUCOSE MONITORING, DIETARY RESTRICTIONS INCLUDING AVOIDANCE OF SUGARY DRINKS/PROCESSED FOODS,  ALONG WITH REGULAR EXERCISE.  I  ALSO STRESSED THE IMPORTANCE OF ANNUAL EYE EXAMS, SELF FOOT CARE AND COMPLIANCE WITH OFFICE VISITS.   Orders: -     Urinalysis, Complete -     Microalbumin / creatinine urine ratio -     EKG 12-Lead  Essential hypertension Assessment & Plan: Chronic, fair control. Goal BP <130/80.  EKG performed, NSR w/o acute changes.   - Continue with olmesartan /hydrochlorothiazide , amlodipine  and spironolactone .  - Follow low sodium diet.  - Reassess in four months.   Orders: -     Urinalysis, Complete -     Microalbumin / creatinine urine ratio -     EKG 12-Lead -     amLODIPine  Besylate; Take 1 tablet (10 mg total) by mouth daily.  Dispense: 90 tablet; Refill: 2  Other orders -     Meloxicam ; Take 1 tablet (15 mg total) by mouth daily as needed for knee pain.  Dispense: 30 tablet; Refill: 1  General health maintenance Routine health maintenance discussed. No changes in bowel habits. Adequate hydration and active lifestyle reported. - Continue regular physical activity and maintain hydration. - Schedule follow-up visits every four months unless lab results indicate otherwise.  Return for 1 YEAR HM, 4 MONTH  DM .  Patient was given opportunity to ask questions. Patient verbalized understanding of the plan and was able to repeat key elements of the plan. All  questions were answered to their satisfaction.   I, Phillip LOISE Slocumb, MD, have reviewed all documentation for this visit. The documentation on 01/31/2024 for the exam, diagnosis, procedures, and orders are all accurate and complete.   IF YOU HAVE BEEN REFERRED TO A SPECIALIST, IT MAY TAKE 1-2 WEEKS TO SCHEDULE/PROCESS THE REFERRAL. IF YOU HAVE NOT HEARD FROM US /SPECIALIST IN TWO WEEKS, PLEASE GIVE US  A CALL AT [phone].   THE PATIENT IS ENCOURAGED TO PRACTICE SOCIAL DISTANCING DUE TO THE COVID-19 PANDEMIC.

## 2024-01-31 NOTE — Assessment & Plan Note (Signed)

## 2024-02-01 LAB — CMP14+EGFR
ALT: 17 IU/L (ref 0–44)
AST: 20 IU/L (ref 0–40)
Albumin: 4.6 g/dL (ref 3.8–4.9)
Alkaline Phosphatase: 67 IU/L (ref 47–123)
BUN/Creatinine Ratio: 20 (ref 9–20)
BUN: 17 mg/dL (ref 6–24)
Bilirubin Total: 0.6 mg/dL (ref 0.0–1.2)
CO2: 24 mmol/L (ref 20–29)
Calcium: 10.1 mg/dL (ref 8.7–10.2)
Chloride: 96 mmol/L (ref 96–106)
Creatinine, Ser: 0.84 mg/dL (ref 0.76–1.27)
Globulin, Total: 2.2 g/dL (ref 1.5–4.5)
Glucose: 137 mg/dL — ABNORMAL HIGH (ref 70–99)
Potassium: 3.8 mmol/L (ref 3.5–5.2)
Sodium: 135 mmol/L (ref 134–144)
Total Protein: 6.8 g/dL (ref 6.0–8.5)
eGFR: 101 mL/min/1.73

## 2024-02-01 LAB — HEMOGLOBIN A1C
Est. average glucose Bld gHb Est-mCnc: 169 mg/dL
Hgb A1c MFr Bld: 7.5 % — ABNORMAL HIGH (ref 4.8–5.6)

## 2024-02-01 LAB — LIPID PANEL
Chol/HDL Ratio: 3.3 ratio (ref 0.0–5.0)
Cholesterol, Total: 130 mg/dL (ref 100–199)
HDL: 40 mg/dL
LDL Chol Calc (NIH): 76 mg/dL (ref 0–99)
Triglycerides: 65 mg/dL (ref 0–149)
VLDL Cholesterol Cal: 14 mg/dL (ref 5–40)

## 2024-02-01 LAB — URINALYSIS, COMPLETE
Bilirubin, UA: NEGATIVE
Glucose, UA: NEGATIVE
Ketones, UA: NEGATIVE
Leukocytes,UA: NEGATIVE
Nitrite, UA: NEGATIVE
Protein,UA: NEGATIVE
RBC, UA: NEGATIVE
Specific Gravity, UA: 1.016 (ref 1.005–1.030)
Urobilinogen, Ur: 0.2 mg/dL (ref 0.2–1.0)
pH, UA: 7.5 (ref 5.0–7.5)

## 2024-02-01 LAB — CBC
Hematocrit: 42.7 % (ref 37.5–51.0)
Hemoglobin: 13.8 g/dL (ref 13.0–17.7)
MCH: 26.9 pg (ref 26.6–33.0)
MCHC: 32.3 g/dL (ref 31.5–35.7)
MCV: 83 fL (ref 79–97)
Platelets: 307 x10E3/uL (ref 150–450)
RBC: 5.13 x10E6/uL (ref 4.14–5.80)
RDW: 13.7 % (ref 11.6–15.4)
WBC: 4.5 x10E3/uL (ref 3.4–10.8)

## 2024-02-01 LAB — PSA: Prostate Specific Ag, Serum: 0.2 ng/mL (ref 0.0–4.0)

## 2024-02-01 LAB — MICROSCOPIC EXAMINATION
Bacteria, UA: NONE SEEN
Casts: NONE SEEN /LPF
Epithelial Cells (non renal): NONE SEEN /HPF (ref 0–10)
RBC, Urine: NONE SEEN /HPF (ref 0–2)
WBC, UA: NONE SEEN /HPF (ref 0–5)

## 2024-02-01 LAB — MICROALBUMIN / CREATININE URINE RATIO
Creatinine, Urine: 76.9 mg/dL
Microalb/Creat Ratio: <4 mg/g{creat} (ref 0–29)
Microalbumin, Urine: <3 ug/mL

## 2024-02-05 ENCOUNTER — Ambulatory Visit: Payer: Self-pay | Admitting: Internal Medicine

## 2024-02-13 ENCOUNTER — Other Ambulatory Visit (HOSPITAL_COMMUNITY): Payer: Self-pay

## 2024-02-15 ENCOUNTER — Other Ambulatory Visit (HOSPITAL_COMMUNITY): Payer: Self-pay

## 2024-06-13 ENCOUNTER — Ambulatory Visit: Payer: Self-pay | Admitting: Internal Medicine

## 2025-02-04 ENCOUNTER — Encounter: Payer: Self-pay | Admitting: Internal Medicine
